# Patient Record
Sex: Female | Born: 1995
Health system: Southern US, Community
[De-identification: ages and names within clinical notes are randomized; demographics above are authoritative.]

## PROBLEM LIST (undated history)

## (undated) ENCOUNTER — Inpatient Hospital Stay (HOSPITAL_COMMUNITY): Payer: Self-pay

## (undated) ENCOUNTER — Ambulatory Visit

## (undated) ENCOUNTER — Emergency Department (HOSPITAL_COMMUNITY): Discharge status: Absent without leave | Payer: Federal, State, Local not specified - PPO

## (undated) DIAGNOSIS — Z3490 Encounter for supervision of normal pregnancy, unspecified, unspecified trimester: Principal | ICD-10-CM

## (undated) DIAGNOSIS — I7300 Raynaud's syndrome without gangrene: Secondary | ICD-10-CM

## (undated) DIAGNOSIS — E119 Type 2 diabetes mellitus without complications: Secondary | ICD-10-CM

## (undated) DIAGNOSIS — Z8632 Personal history of gestational diabetes: Secondary | ICD-10-CM

## (undated) HISTORY — DX: Encounter for supervision of normal pregnancy, unspecified, unspecified trimester: Z34.90

## (undated) HISTORY — DX: Personal history of gestational diabetes: Z86.32

## (undated) HISTORY — DX: Raynaud's syndrome without gangrene: I73.00

## (undated) HISTORY — PX: TONSILLECTOMY: SUR1361

---
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
## 2016-04-23 ENCOUNTER — Other Ambulatory Visit: Payer: Self-pay | Admitting: Obstetrics & Gynecology

## 2016-04-30 ENCOUNTER — Encounter (INDEPENDENT_AMBULATORY_CARE_PROVIDER_SITE_OTHER): Payer: Self-pay

## 2016-04-30 ENCOUNTER — Ambulatory Visit: Payer: Federal, State, Local not specified - PPO | Admitting: Obstetrics & Gynecology

## 2016-05-07 ENCOUNTER — Ambulatory Visit: Payer: Federal, State, Local not specified - PPO | Admitting: Obstetrics & Gynecology

## 2016-05-14 ENCOUNTER — Ambulatory Visit: Payer: Federal, State, Local not specified - PPO | Admitting: Obstetrics & Gynecology

## 2016-12-17 ENCOUNTER — Other Ambulatory Visit: Payer: Self-pay | Admitting: Obstetrics & Gynecology

## 2016-12-17 MED ORDER — ESCITALOPRAM OXALATE 20 MG PO TABS: 20.0000 mg | ORAL_TABLET | Freq: Every day | ORAL | 11 refills | Status: DC

## 2016-12-19 ENCOUNTER — Other Ambulatory Visit: Payer: Self-pay | Admitting: Obstetrics & Gynecology

## 2017-01-08 ENCOUNTER — Encounter: Payer: Self-pay | Admitting: Obstetrics & Gynecology

## 2017-01-08 ENCOUNTER — Ambulatory Visit (INDEPENDENT_AMBULATORY_CARE_PROVIDER_SITE_OTHER): Payer: Federal, State, Local not specified - PPO | Admitting: Obstetrics & Gynecology

## 2017-01-08 VITALS — BP 114/60 | HR 78 | Ht 65.0 in | Wt 229.0 lb

## 2017-01-08 DIAGNOSIS — F339 Major depressive disorder, recurrent, unspecified: Secondary | ICD-10-CM

## 2017-01-08 MED ORDER — VENLAFAXINE HCL ER 75 MG PO CP24: 75.0000 mg | ORAL_CAPSULE | Freq: Every day | ORAL | 3 refills | Status: DC

## 2017-01-08 NOTE — Progress Notes (Signed)
  Subjective:     Rebecca Gibbs is a 22 y.o. female who presents for follow up of depression/anxiety. She has stopped all medications  She states the effexor did better than anything else really  Current symptoms include psychomotor agitation. Symptoms have been gradually improving since that time. Patient denies anhedonia, depressed mood, difficulty concentrating, fatigue, feelings of worthlessness/guilt, hopelessness, hypersomnia, impaired memory, insomnia, psychomotor retardation, recurrent thoughts of death, suicidal attempt, suicidal thoughts with specific plan, suicidal thoughts without plan, weight gain and weight loss. Previous treatment includes: medication. She complains of the following side effects from the treatment: none  The following portions of the patient's history were reviewed and updated as appropriate: allergies, current medications, past family history, past medical history, past social history, past surgical history and problem list.  Review of Systems Pertinent items are noted in HPI.    Objective:    BP 114/60 (BP Location: Right Arm, Patient Position: Sitting, Cuff Size: Normal)   Pulse 78   Ht 5\' 5"  (1.651 m)   Wt 229 lb (103.9 kg)   BMI 38.11 kg/m   General:  alert, cooperative and no distress  Affect & Behavior:  full facial expressions, good grooming, good insight, normal perception, normal reasoning, normal speech pattern and content and normal thought patterns       Assessment:    Depression, stable      Plan:   M  medication: effexor XR 75 mg See if this helps her agitation and hyperkinesis  Referral made to [facility] health     Face to face time:  15 minutes  Greater than 50% of the visit time was spent in counseling and coordination of care with the patient.  The summary and outline of the counseling and care coordination is summarized in the note above.   All questions were answered.

## 2017-01-09 ENCOUNTER — Other Ambulatory Visit: Payer: Self-pay | Admitting: *Deleted

## 2017-01-09 DIAGNOSIS — F339 Major depressive disorder, recurrent, unspecified: Secondary | ICD-10-CM
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
## 2017-02-22 ENCOUNTER — Encounter (HOSPITAL_COMMUNITY): Payer: Self-pay | Admitting: Psychiatry

## 2017-02-22 ENCOUNTER — Ambulatory Visit (INDEPENDENT_AMBULATORY_CARE_PROVIDER_SITE_OTHER): Payer: Federal, State, Local not specified - PPO | Admitting: Psychiatry

## 2017-02-22 VITALS — BP 155/94 | HR 87 | Ht 65.0 in | Wt 218.0 lb

## 2017-02-22 DIAGNOSIS — R450 Nervousness: Secondary | ICD-10-CM

## 2017-02-22 DIAGNOSIS — R51 Headache: Secondary | ICD-10-CM | POA: Diagnosis not present

## 2017-02-22 DIAGNOSIS — Z818 Family history of other mental and behavioral disorders: Secondary | ICD-10-CM | POA: Diagnosis not present

## 2017-02-22 DIAGNOSIS — Z62811 Personal history of psychological abuse in childhood: Secondary | ICD-10-CM | POA: Diagnosis not present

## 2017-02-22 DIAGNOSIS — Z630 Problems in relationship with spouse or partner: Secondary | ICD-10-CM

## 2017-02-22 DIAGNOSIS — F410 Panic disorder [episodic paroxysmal anxiety] without agoraphobia: Secondary | ICD-10-CM

## 2017-02-22 DIAGNOSIS — F419 Anxiety disorder, unspecified: Secondary | ICD-10-CM

## 2017-02-22 DIAGNOSIS — F331 Major depressive disorder, recurrent, moderate: Secondary | ICD-10-CM | POA: Diagnosis not present

## 2017-02-22 DIAGNOSIS — G4700 Insomnia, unspecified: Secondary | ICD-10-CM | POA: Diagnosis not present

## 2017-02-22 LAB — TSH: TSH: 1.81 mIU/L

## 2017-02-22 MED ORDER — SERTRALINE HCL 50 MG PO TABS: ORAL_TABLET | ORAL | 0 refills | Status: DC

## 2017-02-22 NOTE — Patient Instructions (Signed)
1. Start sertraline 25 mg daily for one week, then 50 mg daily 2. Check TSH 3. Referral to therapy 4. Return to clinic in one month for 30 mins

## 2017-02-28 ENCOUNTER — Ambulatory Visit (HOSPITAL_COMMUNITY): Payer: Federal, State, Local not specified - PPO | Admitting: Psychiatry

## 2017-03-07 DIAGNOSIS — J069 Acute upper respiratory infection, unspecified: Secondary | ICD-10-CM | POA: Diagnosis not present
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
## 2017-03-25 ENCOUNTER — Ambulatory Visit (HOSPITAL_COMMUNITY): Payer: Federal, State, Local not specified - PPO | Admitting: Psychiatry

## 2017-03-26 ENCOUNTER — Ambulatory Visit (HOSPITAL_COMMUNITY): Payer: Self-pay | Admitting: Licensed Clinical Social Worker

## 2017-03-27 DIAGNOSIS — S99922A Unspecified injury of left foot, initial encounter: Secondary | ICD-10-CM | POA: Diagnosis not present

## 2017-03-27 DIAGNOSIS — M79672 Pain in left foot: Secondary | ICD-10-CM | POA: Diagnosis not present
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
## 2017-04-02 ENCOUNTER — Ambulatory Visit (HOSPITAL_COMMUNITY): Payer: Federal, State, Local not specified - PPO | Admitting: Psychiatry

## 2017-04-12 ENCOUNTER — Other Ambulatory Visit (HOSPITAL_COMMUNITY): Payer: Self-pay | Admitting: Psychiatry

## 2017-04-12 MED ORDER — SERTRALINE HCL 50 MG PO TABS: 50.0000 mg | ORAL_TABLET | Freq: Every day | ORAL | 0 refills | Status: DC

## 2017-07-15 DIAGNOSIS — J209 Acute bronchitis, unspecified: Secondary | ICD-10-CM | POA: Diagnosis not present

## 2017-07-17 DIAGNOSIS — M130 Polyarthritis, unspecified: Secondary | ICD-10-CM | POA: Diagnosis not present

## 2017-07-17 DIAGNOSIS — R5383 Other fatigue: Secondary | ICD-10-CM | POA: Diagnosis not present

## 2017-07-17 DIAGNOSIS — Z68.41 Body mass index (BMI) pediatric, greater than or equal to 95th percentile for age: Secondary | ICD-10-CM | POA: Diagnosis not present

## 2017-07-17 DIAGNOSIS — R21 Rash and other nonspecific skin eruption: Secondary | ICD-10-CM | POA: Diagnosis not present

## 2017-07-23 DIAGNOSIS — H5213 Myopia, bilateral: Secondary | ICD-10-CM | POA: Diagnosis not present

## 2017-08-09 DIAGNOSIS — H5213 Myopia, bilateral: Secondary | ICD-10-CM | POA: Diagnosis not present

## 2017-08-09 DIAGNOSIS — H52221 Regular astigmatism, right eye: Secondary | ICD-10-CM | POA: Diagnosis not present

## 2017-08-15 DIAGNOSIS — M130 Polyarthritis, unspecified: Secondary | ICD-10-CM | POA: Diagnosis not present

## 2017-11-04 DIAGNOSIS — J019 Acute sinusitis, unspecified: Secondary | ICD-10-CM | POA: Diagnosis not present

## 2017-11-20 DIAGNOSIS — Z3201 Encounter for pregnancy test, result positive: Secondary | ICD-10-CM | POA: Diagnosis not present

## 2017-11-20 DIAGNOSIS — J301 Allergic rhinitis due to pollen: Secondary | ICD-10-CM | POA: Diagnosis not present

## 2017-12-04 ENCOUNTER — Other Ambulatory Visit: Payer: Self-pay | Admitting: Obstetrics and Gynecology

## 2017-12-04 DIAGNOSIS — O3680X0 Pregnancy with inconclusive fetal viability, not applicable or unspecified: Secondary | ICD-10-CM

## 2017-12-05 ENCOUNTER — Ambulatory Visit (INDEPENDENT_AMBULATORY_CARE_PROVIDER_SITE_OTHER): Payer: Federal, State, Local not specified - PPO

## 2017-12-05 ENCOUNTER — Other Ambulatory Visit: Payer: Self-pay | Admitting: Obstetrics and Gynecology

## 2017-12-05 ENCOUNTER — Other Ambulatory Visit: Payer: Federal, State, Local not specified - PPO

## 2017-12-05 DIAGNOSIS — Z3401 Encounter for supervision of normal first pregnancy, first trimester: Secondary | ICD-10-CM | POA: Diagnosis not present

## 2017-12-05 DIAGNOSIS — O3680X0 Pregnancy with inconclusive fetal viability, not applicable or unspecified: Secondary | ICD-10-CM | POA: Diagnosis not present

## 2017-12-05 NOTE — Progress Notes (Signed)
US TA/TV: no IUP visualized,normal ovaries bilat,bilat adnexa's wnl,EEC 10.8 mm,pt is having labs today per Victorino DikeJennifer

## 2017-12-06 LAB — BETA HCG QUANT (REF LAB): HCG QUANT: 164 m[IU]/mL

## 2017-12-07 ENCOUNTER — Emergency Department (HOSPITAL_COMMUNITY)
Admission: EM | Admit: 2017-12-07 | Discharge: 2017-12-07 | Discharge status: Against Medical Advice | Payer: Federal, State, Local not specified - PPO

## 2017-12-07 ENCOUNTER — Encounter: Payer: Self-pay | Admitting: Obstetrics & Gynecology

## 2017-12-07 DIAGNOSIS — Z3A01 Less than 8 weeks gestation of pregnancy: Secondary | ICD-10-CM | POA: Diagnosis not present

## 2017-12-07 DIAGNOSIS — N760 Acute vaginitis: Secondary | ICD-10-CM | POA: Diagnosis not present

## 2017-12-07 DIAGNOSIS — O26851 Spotting complicating pregnancy, first trimester: Secondary | ICD-10-CM | POA: Diagnosis not present

## 2017-12-07 DIAGNOSIS — O039 Complete or unspecified spontaneous abortion without complication: Secondary | ICD-10-CM | POA: Diagnosis not present

## 2017-12-07 DIAGNOSIS — O23591 Infection of other part of genital tract in pregnancy, first trimester: Secondary | ICD-10-CM | POA: Diagnosis not present

## 2017-12-09 ENCOUNTER — Telehealth: Payer: Self-pay | Admitting: Obstetrics & Gynecology

## 2017-12-09 DIAGNOSIS — R799 Abnormal finding of blood chemistry, unspecified: Secondary | ICD-10-CM | POA: Diagnosis not present

## 2017-12-09 DIAGNOSIS — Z5321 Procedure and treatment not carried out due to patient leaving prior to being seen by health care provider: Secondary | ICD-10-CM | POA: Diagnosis not present

## 2017-12-09 NOTE — Telephone Encounter (Signed)
Patient called, would like lab results from last week.  [phone]

## 2017-12-09 NOTE — Telephone Encounter (Signed)
Patient informed HCG 164.

## 2017-12-10 ENCOUNTER — Encounter: Payer: Self-pay | Admitting: Obstetrics & Gynecology

## 2017-12-10 ENCOUNTER — Ambulatory Visit (INDEPENDENT_AMBULATORY_CARE_PROVIDER_SITE_OTHER): Payer: Federal, State, Local not specified - PPO | Admitting: Obstetrics & Gynecology

## 2017-12-10 DIAGNOSIS — O209 Hemorrhage in early pregnancy, unspecified: Secondary | ICD-10-CM | POA: Diagnosis not present

## 2017-12-10 NOTE — Progress Notes (Signed)
Follow up appointment for results  Chief Complaint  Patient presents with  . Vaginal Bleeding    There were no vitals taken for this visit.  DATING AND VIABILITY SONOGRAM   Rebecca Gibbs is a 22 y.o. year old G2P1001 with LMP some time in October,2019.  She has regular menstrual cycles.   She is here today for a confirmatory initial sonogram.    GESTATION: No IUP visualized      FETAL ACTIVITY:          Heart rate         n/a       CERVIX: Appears closed   ADNEXA: The ovaries are normal.   GESTATIONAL AGE AND  BIOMETRICS:  Gestational criteria: Estimated Date of Delivery: unknown LMP, some time in October.  Previous Scans:0  GESTATIONAL SAC No IUP visualized    CROWN RUMP LENGTH                                                                                WORKING EDD: to be determined      TECHNICIAN COMMENTS:  US TA/TV: no IUP visualized,normal ovaries bilat,bilat adnexa's wnl,EEC 10.8 mm,pt is having labs today per Victorino DikeJennifer        A copy of this report including all images has been saved and backed up to a second source for retrieval if needed. All measures and details of the anatomical scan, placentation, fluid volume and pelvic anatomy are contained in that report.  Amber Flora LippsJ Carl 12/05/2017 4:08 PM    HCG 12/5 167 HCG 12/7 184 Repeated yesterday trying to get report  MEDS ordered this encounter: No orders of the defined types were placed in this encounter.   Orders for this encounter: No orders of the defined types were placed in this encounter.   Impression: 1. Bleeding in early pregnancy Most likely failed early pregnancy   Plan: Get HCG level from yesterday but her bleeding would suggest early loss  Follow Up: Return if symptoms worsen or fail to improve.     All questions were answered.  Past Medical History:  Diagnosis Date  . Diabetes mellitus without  complication (HCC)    Gestational DM, on Glyburide  . Pregnant 01/07/2014  . Raynaud's disease     Past Surgical History:  Procedure Laterality Date  . TONSILLECTOMY      OB History    Gravida  2   Para  1   Term  1   Preterm      AB      Living  1     SAB      TAB      Ectopic      Multiple  0   Live Births  1           No Known Allergies  Social History   Socioeconomic History  . Marital status: Single    Spouse name: Not on file  . Number of children: Not on file  . Years of education: Not on file  . Highest education level: Not on file  Occupational History  . Not on file  Social Needs  . Financial resource strain: Not  on file  . Food insecurity:    Worry: Not on file    Inability: Not on file  . Transportation needs:    Medical: Not on file    Non-medical: Not on file  Tobacco Use  . Smoking status: Never Smoker  . Smokeless tobacco: Never Used  Substance and Sexual Activity  . Alcohol use: No  . Drug use: No  . Sexual activity: Yes    Partners: Male    Birth control/protection: Diaphragm  Lifestyle  . Physical activity:    Days per week: Not on file    Minutes per session: Not on file  . Stress: Not on file  Relationships  . Social connections:    Talks on phone: Not on file    Gets together: Not on file    Attends religious service: Not on file    Active member of club or organization: Not on file    Attends meetings of clubs or organizations: Not on file    Relationship status: Not on file  Other Topics Concern  . Not on file  Social History Narrative  . Not on file    Family History  Problem Relation Age of Onset  . Other Paternal Grandfather        has a pacemaker  . Cancer Maternal Grandmother        cervical  . Depression Maternal Grandmother   . Cancer Maternal Grandfather        lung that went to brain  . Diabetes Mother   . Arthritis Mother   . Other Mother        auto immune disease  . Depression Mother

## 2018-03-14 DIAGNOSIS — K080 Exfoliation of teeth due to systemic causes: Secondary | ICD-10-CM | POA: Diagnosis not present

## 2018-03-17 DIAGNOSIS — K8042 Calculus of bile duct with acute cholecystitis without obstruction: Secondary | ICD-10-CM | POA: Diagnosis not present

## 2018-03-17 DIAGNOSIS — M545 Low back pain: Secondary | ICD-10-CM | POA: Diagnosis not present

## 2018-03-17 DIAGNOSIS — R109 Unspecified abdominal pain: Secondary | ICD-10-CM | POA: Diagnosis not present

## 2018-03-17 DIAGNOSIS — N23 Unspecified renal colic: Secondary | ICD-10-CM | POA: Diagnosis not present

## 2018-03-24 DIAGNOSIS — R1084 Generalized abdominal pain: Secondary | ICD-10-CM | POA: Diagnosis not present

## 2018-03-24 DIAGNOSIS — K8020 Calculus of gallbladder without cholecystitis without obstruction: Secondary | ICD-10-CM | POA: Diagnosis not present

## 2018-05-01 ENCOUNTER — Telehealth: Payer: Self-pay

## 2018-05-01 NOTE — Telephone Encounter (Signed)
Called pt to schedule apt. Pt unavailable will call again to schedule apt.

## 2018-05-08 ENCOUNTER — Ambulatory Visit (INDEPENDENT_AMBULATORY_CARE_PROVIDER_SITE_OTHER): Payer: Federal, State, Local not specified - PPO | Admitting: General Surgery

## 2018-05-08 ENCOUNTER — Encounter: Payer: Self-pay | Admitting: General Surgery

## 2018-05-08 ENCOUNTER — Other Ambulatory Visit: Payer: Self-pay

## 2018-05-08 VITALS — BP 139/73 | HR 88 | Temp 98.9°F | Resp 16 | Wt 254.0 lb

## 2018-05-08 DIAGNOSIS — K8020 Calculus of gallbladder without cholecystitis without obstruction: Secondary | ICD-10-CM | POA: Insufficient documentation

## 2018-05-08 NOTE — Patient Instructions (Signed)
Laparoscopic Cholecystectomy Laparoscopic cholecystectomy is surgery to remove the gallbladder. The gallbladder is a pear-shaped organ that lies beneath the liver on the right side of the body. The gallbladder stores bile, which is a fluid that helps the body to digest fats. Cholecystectomy is often done for inflammation of the gallbladder (cholecystitis). This condition is usually caused by a buildup of gallstones (cholelithiasis) in the gallbladder. Gallstones can block the flow of bile, which can result in inflammation and pain. In severe cases, emergency surgery may be required. This procedure is done though small incisions in your abdomen (laparoscopic surgery). A thin scope with a camera (laparoscope) is inserted through one incision. Thin surgical instruments are inserted through the other incisions. In some cases, a laparoscopic procedure may be turned into a type of surgery that is done through a larger incision (open surgery). Tell a health care provider about:  Any allergies you have.  All medicines you are taking, including vitamins, herbs, eye drops, creams, and over-the-counter medicines.  Any problems you or family members have had with anesthetic medicines.  Any blood disorders you have.  Any surgeries you have had.  Any medical conditions you have.  Whether you are pregnant or may be pregnant. What are the risks? Generally, this is a safe procedure. However, problems may occur, including:  Infection.  Bleeding.  Allergic reactions to medicines.  Damage to other structures or organs.  A stone remaining in the common bile duct. The common bile duct carries bile from the gallbladder into the small intestine.  A bile leak from the cyst duct that is clipped when your gallbladder is removed. Medicines  Ask your health care provider about: ? Changing or stopping your regular medicines. This is especially important if you are taking diabetes medicines or blood  thinners. ? Taking medicines such as aspirin and ibuprofen. These medicines can thin your blood. Do not take these medicines before your procedure if your health care provider instructs you not to.  You may be given antibiotic medicine to help prevent infection. General instructions  Let your health care provider know if you develop a cold or an infection before surgery.  Plan to have someone take you home from the hospital or clinic.  Ask your health care provider how your surgical site will be marked or identified. What happens during the procedure?   To reduce your risk of infection: ? Your health care team will wash or sanitize their hands. ? Your skin will be washed with soap. ? Hair may be removed from the surgical area.  An IV tube may be inserted into one of your veins.  You will be given one or more of the following: ? A medicine to help you relax (sedative). ? A medicine to make you fall asleep (general anesthetic).  A breathing tube will be placed in your mouth.  Your surgeon will make several small cuts (incisions) in your abdomen.  The laparoscope will be inserted through one of the small incisions. The camera on the laparoscope will send images to a TV screen (monitor) in the operating room. This lets your surgeon see inside your abdomen.  Air-like gas will be pumped into your abdomen. This will expand your abdomen to give the surgeon more room to perform the surgery.  Other tools that are needed for the procedure will be inserted through the other incisions. The gallbladder will be removed through one of the incisions.  Your common bile duct may be examined. If stones are found   in the common bile duct, they may be removed.  After your gallbladder has been removed, the incisions will be closed with stitches (sutures), staples, or skin glue.  Your incisions may be covered with a bandage (dressing). The procedure may vary among health care providers and  hospitals. What happens after the procedure?  Your blood pressure, heart rate, breathing rate, and blood oxygen level will be monitored until the medicines you were given have worn off.  You will be given medicines as needed to control your pain.  Do not drive for 24 hours if you were given a sedative. This information is not intended to replace advice given to you by your health care provider. Make sure you discuss any questions you have with your health care provider. Document Released: 12/18/2004 Document Revised: 11/15/2016 Document Reviewed: 06/06/2015 Elsevier Interactive Patient Education  2019 Else[employer] Inc. Cholelithiasis  Cholelithiasis is also called "gallstones." It is a kind of gallbladder disease. The gallbladder is an organ that stores a liquid (bile) that helps you digest fat. Gallstones may not cause symptoms (may be silent gallstones) until they cause a blockage, and then they can cause pain (gallbladder attack). Follow these instructions at home:  Take over-the-counter and prescription medicines only as told by your doctor.  Stay at a healthy weight.  Eat healthy foods. This includes: ? Eating fewer fatty foods, like fried foods. ? Eating fewer refined carbs (refined carbohydrates). Refined carbs are breads and grains that are highly processed, like white bread and white rice. Instead, choose whole grains like whole-wheat bread and brown rice. ? Eating more fiber. Almonds, fresh fruit, and beans are healthy sources of fiber.  Keep all follow-up visits as told by your doctor. This is important. Contact a doctor if:  You have sudden pain in the upper right side of your belly (abdomen). Pain might spread to your right shoulder or your chest. This may be a sign of a gallbladder attack.  You feel sick to your stomach (are nauseous).  You throw up (vomit).  You have been diagnosed with gallstones that have no symptoms and you get: ? Belly pain. ? Discomfort, burning,  or fullness in the upper part of your belly (indigestion). Get help right away if:  You have sudden pain in the upper right side of your belly, and it lasts for more than 2 hours.  You have belly pain that lasts for more than 5 hours.  You have a fever or chills.  You keep feeling sick to your stomach or you keep throwing up.  Your skin or the whites of your eyes turn yellow (jaundice).  You have dark-colored pee (urine).  You have light-colored poop (stool). Summary  Cholelithiasis is also called "gallstones."  The gallbladder is an organ that stores a liquid (bile) that helps you digest fat.  Silent gallstones are gallstones that do not cause symptoms.  A gallbladder attack may cause sudden pain in the upper right side of your belly. Pain might spread to your right shoulder or your chest. If this happens, contact your doctor.  If you have sudden pain in the upper right side of your belly that lasts for more than 2 hours, get help right away. This information is not intended to replace advice given to you by your health care provider. Make sure you discuss any questions you have with your health care provider. Document Released: 06/06/2007 Document Revised: 09/04/2015 Document Reviewed: 09/04/2015 Elsevier Interactive Patient Education  2019 Elsevier Inc.  
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
## 2018-05-09 MED ORDER — OMEPRAZOLE 40 MG PO CPDR: 40.0000 mg | DELAYED_RELEASE_CAPSULE | Freq: Every day | ORAL | 1 refills | Status: DC

## 2018-05-12 NOTE — Patient Instructions (Signed)
Rebecca Gibbs  05/12/2018     @PREFPERIOPPHARMACY @   Your procedure is scheduled on  05/19/2018 .  Report to [facility] at  1020  A.M.  Call this number if you have problems the morning of surgery:  [phone]   Remember:  Do not eat or drink after midnight.                        Take these medicines the morning of surgery with A SIP OF WATER: Prilosec.    Do not wear jewelry, make-up or nail polish.  Do not wear lotions, powders, or perfumes, or deodorant.  Do not shave 48 hours prior to surgery.  Men may shave face and neck.  Do not bring valuables to the hospital.  [facility] is not responsible for any belongings or valuables.  Contacts, dentures or bridgework may not be worn into surgery.  Leave your suitcase in the car.  After surgery it may be brought to your room.  For patients admitted to the hospital, discharge time will be determined by your treatment team.  Patients discharged the day of surgery will not be allowed to drive home.   Name and phone number of your driver:   family Special instructions:  None  Please read over the following fact sheets that you were given. Anesthesia Post-op Instructions and Care and Recovery After Surgery       Laparoscopic Cholecystectomy, Care After This sheet gives you information about how to care for yourself after your procedure. Your health care provider may also give you more specific instructions. If you have problems or questions, contact your health care provider. What can I expect after the procedure? After the procedure, it is common to have:  Pain at your incision sites. You will be given medicines to control this pain.  Mild nausea or vomiting.  Bloating and possible shoulder pain from the air-like gas that was used during the procedure. Follow these instructions at home: Incision care   Follow instructions from your health care provider about how to take care of your incisions. Make sure you:  ? Wash your hands with soap and water before you change your bandage (dressing). If soap and water are not available, use hand sanitizer. ? Change your dressing as told by your health care provider. ? Leave stitches (sutures), skin glue, or adhesive strips in place. These skin closures may need to be in place for 2 weeks or longer. If adhesive strip edges start to loosen and curl up, you may trim the loose edges. Do not remove adhesive strips completely unless your health care provider tells you to do that.  Do not take baths, swim, or use a hot tub until your health care provider approves. Ask your health care provider if you can take showers. You may only be allowed to take sponge baths for bathing.  Check your incision area every day for signs of infection. Check for: ? More redness, swelling, or pain. ? More fluid or blood. ? Warmth. ? Pus or a bad smell. Activity  Do not drive or use heavy machinery while taking prescription pain medicine.  Do not lift anything that is heavier than 10 lb (4.5 kg) until your health care provider approves.  Do not play contact sports until your health care provider approves.  Do not drive for 24 hours if you were given a medicine to help you relax (sedative).  Rest  as needed. Do not return to work or school until your health care provider approves. General instructions  Take over-the-counter and prescription medicines only as told by your health care provider.  To prevent or treat constipation while you are taking prescription pain medicine, your health care provider may recommend that you: ? Drink enough fluid to keep your urine clear or pale yellow. ? Take over-the-counter or prescription medicines. ? Eat foods that are high in fiber, such as fresh fruits and vegetables, whole grains, and beans. ? Limit foods that are high in fat and processed sugars, such as fried and sweet foods. Contact a health care provider if:  You develop a rash.  You  have more redness, swelling, or pain around your incisions.  You have more fluid or blood coming from your incisions.  Your incisions feel warm to the touch.  You have pus or a bad smell coming from your incisions.  You have a fever.  One or more of your incisions breaks open. Get help right away if:  You have trouble breathing.  You have chest pain.  You have increasing pain in your shoulders.  You faint or feel dizzy when you stand.  You have severe pain in your abdomen.  You have nausea or vomiting that lasts for more than one day.  You have leg pain. This information is not intended to replace advice given to you by your health care provider. Make sure you discuss any questions you have with your health care provider. Document Released: 12/18/2004 Document Revised: 07/09/2015 Document Reviewed: 06/06/2015 Elsevier Interactive Patient Education  2019 [place] Anesthesia, Adult, Care After This sheet gives you information about how to care for yourself after your procedure. Your health care provider may also give you more specific instructions. If you have problems or questions, contact your health care provider. What can I expect after the procedure? After the procedure, the following side effects are common:  Pain or discomfort at the IV site.  Nausea.  Vomiting.  Sore throat.  Trouble concentrating.  Feeling cold or chills.  Weak or tired.  Sleepiness and fatigue.  Soreness and body aches. These side effects can affect parts of the body that were not involved in surgery. Follow these instructions at home:  For at least 24 hours after the procedure:  Have a responsible adult stay with you. It is important to have someone help care for you until you are awake and alert.  Rest as needed.  Do not: ? Participate in activities in which you could fall or become injured. ? Drive. ? Use heavy machinery. ? Drink alcohol. ? Take sleeping pills  or medicines that cause drowsiness. ? Make important decisions or sign legal documents. ? Take care of children on your own. Eating and drinking  Follow any instructions from your health care provider about eating or drinking restrictions.  When you feel hungry, start by eating small amounts of foods that are soft and easy to digest (bland), such as toast. Gradually return to your regular diet.  Drink enough fluid to keep your urine pale yellow.  If you vomit, rehydrate by drinking water, juice, or clear broth. General instructions  If you have sleep apnea, surgery and certain medicines can increase your risk for breathing problems. Follow instructions from your health care provider about wearing your sleep device: ? Anytime you are sleeping, including during daytime naps. ? While taking prescription pain medicines, sleeping medicines, or medicines that make you drowsy.  Return to your normal activities as told by your health care provider. Ask your health care provider what activities are safe for you.  Take over-the-counter and prescription medicines only as told by your health care provider.  If you smoke, do not smoke without supervision.  Keep all follow-up visits as told by your health care provider. This is important. Contact a health care provider if:  You have nausea or vomiting that does not get better with medicine.  You cannot eat or drink without vomiting.  You have pain that does not get better with medicine.  You are unable to pass urine.  You develop a skin rash.  You have a fever.  You have redness around your IV site that gets worse. Get help right away if:  You have difficulty breathing.  You have chest pain.  You have blood in your urine or stool, or you vomit blood. Summary  After the procedure, it is common to have a sore throat or nausea. It is also common to feel tired.  Have a responsible adult stay with you for the first 24 hours after  general anesthesia. It is important to have someone help care for you until you are awake and alert.  When you feel hungry, start by eating small amounts of foods that are soft and easy to digest (bland), such as toast. Gradually return to your regular diet.  Drink enough fluid to keep your urine pale yellow.  Return to your normal activities as told by your health care provider. Ask your health care provider what activities are safe for you. This information is not intended to replace advice given to you by your health care provider. Make sure you discuss any questions you have with your health care provider. Document Released: 03/26/2000 Document Revised: 08/03/2016 Document Reviewed: 08/03/2016 Elsevier Interactive Patient Education  2019 [employer].

## 2018-05-12 NOTE — H&P (Signed)
[facility] History and Physical  Reason for Referral: Cholelithiasis  Referring Physician:  Arlyss Repress Allwardt PA      Chief Complaint    Pre-op Exam      Rebecca Gibbs is a 23 y.o. female.  HPI:  Rebecca Gibbs is a 23 yo who reports having epigastric abdominal pain after eating greasy and fatty foods since the age of 74-13. She says she has altered her diet, but still has these issues whenever she eats anything greasy. She cannot eat [place], [place] food, or hamburger. She recently had an attack a few weeks ago and went to [facility] after speaking with her PCP. There labs were done but the [place] techs had gone for the day. She had reported normal labs and her vitals were good. She was discharged with plans for an outpatient [place].  She followed up and the US demonstrated gallstones.  She says that since that attack she has continued to have some achy pain in the epigastric region and that she has some associated nausea/vomiting.  She eats a lot of tuna because this does not cause the pain.       Past Medical History:  Diagnosis Date  . Diabetes mellitus without complication (HCC)    Gestational DM, on Glyburide  . Pregnant 01/07/2014  . Raynaud's disease          Past Surgical History:  Procedure Laterality Date  . TONSILLECTOMY           Family History  Problem Relation Age of Onset  . Other Paternal Grandfather        has a pacemaker  . Cancer Maternal Grandmother        cervical  . Depression Maternal Grandmother   . Cancer Maternal Grandfather        lung that went to brain  . Diabetes Mother   . Arthritis Mother   . Other Mother        auto immune disease  . Depression Mother     Social History       Tobacco Use  . Smoking status: Never Smoker  . Smokeless tobacco: Never Used  Substance Use Topics  . Alcohol use: No  . Drug use: No    Medications: I have reviewed the patient's current medications.  Allergies as of 05/08/2018   No Active Allergies        Medication List       Accurate as of May 08, 2018  1:59 PM. If you have any questions, ask your nurse or doctor.        etonogestrel-ethinyl estradiol 0.12-0.015 MG/24HR vaginal ring Commonly known as:  NuvaRing INSERT 1 RING VAGINALLY AS DIRECTED. REMOVE AFTER 3 WEEKS & WAIT 7 DAYS BEFORE INSERTING A NEW RING   naproxen sodium 550 MG tablet Commonly known as:  Anaprox DS Take 1 tablet (550 mg total) by mouth 2 (two) times daily with a meal.   sertraline 50 MG tablet Commonly known as:  ZOLOFT Take 1 tablet (50 mg total) by mouth daily.        ROS:  A comprehensive review of systems was negative except for: Gastrointestinal: positive for abdominal pain, nausea, reflux symptoms and vomiting  Blood pressure 139/73, pulse 88, temperature 98.9 F (37.2 C), temperature source Temporal, resp. rate 16, weight 254 lb (115.2 kg), SpO2 96 %. Physical Exam Vitals signs reviewed.  Constitutional:      Appearance: Normal appearance.  HENT:     Head: Normocephalic.  Nose: Nose normal.     Mouth/Throat:     Mouth: Mucous membranes are moist.  Eyes:     Pupils: Pupils are equal, round, and reactive to light.  Neck:     Musculoskeletal: Normal range of motion.  Cardiovascular:     Rate and Rhythm: Normal rate and regular rhythm.  Pulmonary:     Effort: Pulmonary effort is normal.     Breath sounds: Normal breath sounds.  Abdominal:     General: There is no distension.     Palpations: Abdomen is soft.     Tenderness: There is abdominal tenderness in the right upper quadrant and epigastric area.  Musculoskeletal: Normal range of motion.        General: No swelling.  Skin:    General: Skin is warm and dry.  Neurological:     General: No focal deficit present.     Mental Status: She is alert and oriented to person, place, and time.  Psychiatric:        Mood and Affect: Mood normal.        Behavior:  Behavior normal.        Thought Content: Thought content normal.        Judgment: Judgment normal.     [facility]: [facility]       Assessment & Plan:  Rebecca Kailrica D Fawaz is a 23 y.o. female with gallstones that are symptomatic. She has been having pain since her last major attack, and continues to have some nausea at baseline. She says that she is ready to get the gallbladder removed.   -PLAN: I counseled the patient about the indication, risks and benefits of laparoscopic cholecystectomy.  She understands there is a very small chance for bleeding, infection, injury to normal structures (including common bile duct), conversion to open surgery, persistent symptoms, evolution of postcholecystectomy diarrhea, need for secondary interventions, anesthesia reaction, cardiopulmonary issues and other risks not specifically detailed here. I described the expected recovery, the plan for follow-up and the restrictions during the recovery phase.  All questions were answered.  -She asked about a prescription for something that might help her eat and said her mom took something similar for acid.  I have prescribed her omeprazole to trial until we get her gallbladder out.   All questions were answered to the satisfaction of the patient.  We discussed the current COVID 19 pandemic and that elective cases are being added for 05/19/2018 onward. We discussed COVID 19 testing preop, and to expect to get tested.   After careful consideration, Rebecca Gibbs has decided to proceed.    Lucretia RoersLindsay C Deseree Zemaitis 05/08/2018, 1:59 PM   Preop CMP ordered since no LFTs on record. Preop COVID testing ordered.

## 2018-05-13 ENCOUNTER — Other Ambulatory Visit: Payer: Self-pay

## 2018-05-13 ENCOUNTER — Encounter (HOSPITAL_COMMUNITY): Payer: Self-pay

## 2018-05-13 ENCOUNTER — Encounter (HOSPITAL_COMMUNITY)
Admission: RE | Admit: 2018-05-13 | Discharge: 2018-05-13 | Disposition: A | Discharge status: Home / Self Care | Payer: Federal, State, Local not specified - PPO | Source: Ambulatory Visit | Attending: General Surgery | Admitting: General Surgery

## 2018-05-13 DIAGNOSIS — Z01812 Encounter for preprocedural laboratory examination: Secondary | ICD-10-CM | POA: Insufficient documentation

## 2018-05-13 DIAGNOSIS — K8020 Calculus of gallbladder without cholecystitis without obstruction: Secondary | ICD-10-CM | POA: Diagnosis not present

## 2018-05-13 LAB — COMPREHENSIVE METABOLIC PANEL
ALT: 36 U/L (ref 0–44)
AST: 23 U/L (ref 15–41)
Albumin: 3.8 g/dL (ref 3.5–5.0)
Alkaline Phosphatase: 56 U/L (ref 38–126)
Anion gap: 9 (ref 5–15)
BUN: 12 mg/dL (ref 6–20)
CO2: 23 mmol/L (ref 22–32)
Calcium: 8.5 mg/dL — ABNORMAL LOW (ref 8.9–10.3)
Chloride: 107 mmol/L (ref 98–111)
Creatinine, Ser: 0.69 mg/dL (ref 0.44–1.00)
GFR calc Af Amer: >60 mL/min (ref >60)
GFR calc non Af Amer: >60 mL/min (ref >60)
Glucose, Bld: 103 mg/dL — ABNORMAL HIGH (ref 70–99)
Potassium: 3.7 mmol/L (ref 3.5–5.1)
Sodium: 139 mmol/L (ref 135–145)
Total Bilirubin: 0.6 mg/dL (ref 0.3–1.2)
Total Protein: 7.2 g/dL (ref 6.5–8.1)

## 2018-05-13 LAB — CBC WITH DIFFERENTIAL/PLATELET
Abs Immature Granulocytes: 0.04 10*3/uL (ref 0.00–0.07)
Basophils Absolute: 0 10*3/uL (ref 0.0–0.1)
Basophils Relative: 1 %
Eosinophils Absolute: 0.3 10*3/uL (ref 0.0–0.5)
Eosinophils Relative: 3 %
HCT: 37.4 % (ref 36.0–46.0)
Hemoglobin: 12.1 g/dL (ref 12.0–15.0)
Immature Granulocytes: 1 %
Lymphocytes Relative: 30 %
Lymphs Abs: 2.5 10*3/uL (ref 0.7–4.0)
MCH: 27.6 pg (ref 26.0–34.0)
MCHC: 32.4 g/dL (ref 30.0–36.0)
MCV: 85.4 fL (ref 80.0–100.0)
Monocytes Absolute: 0.6 10*3/uL (ref 0.1–1.0)
Monocytes Relative: 7 %
Neutro Abs: 4.9 10*3/uL (ref 1.7–7.7)
Neutrophils Relative %: 58 %
Platelets: 303 10*3/uL (ref 150–400)
RBC: 4.38 MIL/uL (ref 3.87–5.11)
RDW: 14.9 % (ref 11.5–15.5)
WBC: 8.4 10*3/uL (ref 4.0–10.5)
nRBC: 0 % (ref 0.0–0.2)

## 2018-05-13 LAB — HCG, QUANTITATIVE, PREGNANCY: hCG, Beta Chain, Quant, S: <1 m[IU]/mL (ref <5)

## 2018-05-15 ENCOUNTER — Other Ambulatory Visit (HOSPITAL_COMMUNITY)
Admission: RE | Admit: 2018-05-15 | Discharge: 2018-05-15 | Disposition: A | Discharge status: Home / Self Care | Payer: Federal, State, Local not specified - PPO | Source: Ambulatory Visit | Attending: General Surgery | Admitting: General Surgery

## 2018-05-15 ENCOUNTER — Other Ambulatory Visit: Payer: Self-pay

## 2018-05-15 DIAGNOSIS — Z79899 Other long term (current) drug therapy: Secondary | ICD-10-CM | POA: Diagnosis not present

## 2018-05-15 DIAGNOSIS — Z6841 Body Mass Index (BMI) 40.0 and over, adult: Secondary | ICD-10-CM | POA: Diagnosis not present

## 2018-05-15 DIAGNOSIS — K8010 Calculus of gallbladder with chronic cholecystitis without obstruction: Secondary | ICD-10-CM | POA: Diagnosis not present

## 2018-05-15 DIAGNOSIS — Z1159 Encounter for screening for other viral diseases: Secondary | ICD-10-CM | POA: Diagnosis not present

## 2018-05-15 DIAGNOSIS — E669 Obesity, unspecified: Secondary | ICD-10-CM | POA: Diagnosis not present

## 2018-05-15 DIAGNOSIS — Z793 Long term (current) use of hormonal contraceptives: Secondary | ICD-10-CM | POA: Diagnosis not present

## 2018-05-15 DIAGNOSIS — K8020 Calculus of gallbladder without cholecystitis without obstruction: Secondary | ICD-10-CM | POA: Diagnosis not present

## 2018-05-16 LAB — NOVEL CORONAVIRUS, NAA (HOSP ORDER, SEND-OUT TO REF LAB; TAT 18-24 HRS): SARS-CoV-2, NAA: NOT DETECTED

## 2018-05-19 ENCOUNTER — Ambulatory Visit (HOSPITAL_COMMUNITY): Payer: Federal, State, Local not specified - PPO | Admitting: Anesthesiology

## 2018-05-19 ENCOUNTER — Encounter (HOSPITAL_COMMUNITY)
Admission: RE | Disposition: A | Discharge status: Home / Self Care | Payer: Self-pay | Source: Home / Self Care | Attending: General Surgery

## 2018-05-19 ENCOUNTER — Ambulatory Visit (HOSPITAL_COMMUNITY)
Admission: RE | Admit: 2018-05-19 | Discharge: 2018-05-19 | Disposition: A | Discharge status: Home / Self Care | Payer: Federal, State, Local not specified - PPO | Attending: General Surgery | Admitting: General Surgery

## 2018-05-19 ENCOUNTER — Other Ambulatory Visit: Payer: Self-pay

## 2018-05-19 ENCOUNTER — Encounter (HOSPITAL_COMMUNITY): Payer: Self-pay

## 2018-05-19 DIAGNOSIS — K8020 Calculus of gallbladder without cholecystitis without obstruction: Secondary | ICD-10-CM | POA: Diagnosis present

## 2018-05-19 DIAGNOSIS — Z6841 Body Mass Index (BMI) 40.0 and over, adult: Secondary | ICD-10-CM | POA: Diagnosis not present

## 2018-05-19 DIAGNOSIS — Z793 Long term (current) use of hormonal contraceptives: Secondary | ICD-10-CM | POA: Insufficient documentation

## 2018-05-19 DIAGNOSIS — K8010 Calculus of gallbladder with chronic cholecystitis without obstruction: Secondary | ICD-10-CM | POA: Insufficient documentation

## 2018-05-19 DIAGNOSIS — Z79899 Other long term (current) drug therapy: Secondary | ICD-10-CM | POA: Diagnosis not present

## 2018-05-19 DIAGNOSIS — E669 Obesity, unspecified: Secondary | ICD-10-CM | POA: Diagnosis not present

## 2018-05-19 DIAGNOSIS — Z1159 Encounter for screening for other viral diseases: Secondary | ICD-10-CM | POA: Insufficient documentation

## 2018-05-19 HISTORY — PX: CHOLECYSTECTOMY: SHX55

## 2018-05-19 SURGERY — LAPAROSCOPIC CHOLECYSTECTOMY
Anesthesia: General

## 2018-05-19 MED ORDER — SUCCINYLCHOLINE CHLORIDE 20 MG/ML IJ SOLN
INTRAMUSCULAR | Status: DC | PRN
  Administered 2018-05-19: 120 mg via INTRAVENOUS

## 2018-05-19 MED ORDER — LACTATED RINGERS IV SOLN
INTRAVENOUS | Status: DC
  Administered 2018-05-19: 11:00:00 via INTRAVENOUS

## 2018-05-19 MED ORDER — FENTANYL CITRATE (PF) 100 MCG/2ML IJ SOLN
INTRAMUSCULAR | Status: AC
  Filled 2018-05-19: qty 2

## 2018-05-19 MED ORDER — BUPIVACAINE HCL (PF) 0.5 % IJ SOLN
INTRAMUSCULAR | Status: AC
  Filled 2018-05-19: qty 30

## 2018-05-19 MED ORDER — SODIUM CHLORIDE 0.9 % IR SOLN
Status: DC | PRN
  Administered 2018-05-19: 1000 mL

## 2018-05-19 MED ORDER — BUPIVACAINE LIPOSOME 1.3 % IJ SUSP
INTRAMUSCULAR | Status: AC
  Filled 2018-05-19: qty 20

## 2018-05-19 MED ORDER — DIPHENHYDRAMINE HCL 50 MG/ML IJ SOLN
25.0000 mg | Freq: Once | INTRAMUSCULAR | Status: AC
  Administered 2018-05-19: 25 mg via INTRAVENOUS

## 2018-05-19 MED ORDER — SODIUM CHLORIDE 0.9 % IV SOLN
2.0000 g | INTRAVENOUS | Status: AC
  Administered 2018-05-19: 2 g via INTRAVENOUS

## 2018-05-19 MED ORDER — HYDROCODONE-ACETAMINOPHEN 7.5-325 MG PO TABS
1.0000 | ORAL_TABLET | Freq: Once | ORAL | Status: AC | PRN
  Administered 2018-05-19: 1 via ORAL
  Filled 2018-05-19: qty 1

## 2018-05-19 MED ORDER — LACTATED RINGERS IV SOLN: INTRAVENOUS | Status: DC

## 2018-05-19 MED ORDER — OXYCODONE HCL 5 MG PO TABS: 5.0000 mg | ORAL_TABLET | ORAL | 0 refills | Status: DC | PRN

## 2018-05-19 MED ORDER — BUPIVACAINE HCL (PF) 0.5 % IJ SOLN
INTRAMUSCULAR | Status: DC | PRN
  Administered 2018-05-19: 10 mL

## 2018-05-19 MED ORDER — CHLORHEXIDINE GLUCONATE CLOTH 2 % EX PADS: 6.0000 | MEDICATED_PAD | Freq: Once | CUTANEOUS | Status: DC

## 2018-05-19 MED ORDER — ONDANSETRON HCL 4 MG/2ML IJ SOLN
INTRAMUSCULAR | Status: DC | PRN
  Administered 2018-05-19: 4 mg via INTRAVENOUS

## 2018-05-19 MED ORDER — MIDAZOLAM HCL 2 MG/2ML IJ SOLN
INTRAMUSCULAR | Status: AC
  Filled 2018-05-19: qty 2

## 2018-05-19 MED ORDER — HEMOSTATIC AGENTS (NO CHARGE) OPTIME
TOPICAL | Status: DC | PRN
  Administered 2018-05-19: 1 via TOPICAL

## 2018-05-19 MED ORDER — FENTANYL CITRATE (PF) 100 MCG/2ML IJ SOLN
INTRAMUSCULAR | Status: DC | PRN
  Administered 2018-05-19 (×2): 50 ug via INTRAVENOUS
  Administered 2018-05-19: 100 ug via INTRAVENOUS

## 2018-05-19 MED ORDER — HYDROMORPHONE HCL 1 MG/ML IJ SOLN
0.2500 mg | INTRAMUSCULAR | Status: DC | PRN
  Administered 2018-05-19 (×2): 0.5 mg via INTRAVENOUS
  Filled 2018-05-19 (×2): qty 0.5

## 2018-05-19 MED ORDER — DIPHENHYDRAMINE HCL 50 MG/ML IJ SOLN
INTRAMUSCULAR | Status: AC
  Filled 2018-05-19: qty 1

## 2018-05-19 MED ORDER — MIDAZOLAM HCL 5 MG/5ML IJ SOLN
INTRAMUSCULAR | Status: DC | PRN
  Administered 2018-05-19 (×2): 2 mg via INTRAVENOUS

## 2018-05-19 MED ORDER — PROPOFOL 10 MG/ML IV BOLUS
INTRAVENOUS | Status: DC | PRN
  Administered 2018-05-19: 40 mg via INTRAVENOUS
  Administered 2018-05-19: 160 mg via INTRAVENOUS

## 2018-05-19 MED ORDER — PROPOFOL 10 MG/ML IV BOLUS
INTRAVENOUS | Status: AC
  Filled 2018-05-19: qty 20

## 2018-05-19 MED ORDER — DOCUSATE SODIUM 100 MG PO CAPS: 100.0000 mg | ORAL_CAPSULE | Freq: Two times a day (BID) | ORAL | 2 refills | Status: DC

## 2018-05-19 MED ORDER — MEPERIDINE HCL 50 MG/ML IJ SOLN: 6.2500 mg | INTRAMUSCULAR | Status: DC | PRN

## 2018-05-19 MED ORDER — ONDANSETRON HCL 4 MG/2ML IJ SOLN
INTRAMUSCULAR | Status: AC
  Filled 2018-05-19: qty 2

## 2018-05-19 MED ORDER — SODIUM CHLORIDE 0.9 % IV SOLN
INTRAVENOUS | Status: AC
  Filled 2018-05-19: qty 2

## 2018-05-19 MED ORDER — ROCURONIUM BROMIDE 100 MG/10ML IV SOLN
INTRAVENOUS | Status: DC | PRN
  Administered 2018-05-19: 25 mg via INTRAVENOUS
  Administered 2018-05-19: 10 mg via INTRAVENOUS

## 2018-05-19 MED ORDER — SUGAMMADEX SODIUM 200 MG/2ML IV SOLN
INTRAVENOUS | Status: DC | PRN
  Administered 2018-05-19: 100 mg via INTRAVENOUS

## 2018-05-19 MED ORDER — PROMETHAZINE HCL 25 MG/ML IJ SOLN: 6.2500 mg | INTRAMUSCULAR | Status: DC | PRN

## 2018-05-19 SURGICAL SUPPLY — 46 items
ADH SKN CLS APL DERMABOND .7 (GAUZE/BANDAGES/DRESSINGS) ×1
APL PRP STRL LF DISP 70% ISPRP (MISCELLANEOUS) ×1
APPLIER CLIP ROT 10 11.4 M/L (STAPLE) ×2
APR CLP MED LRG 11.4X10 (STAPLE) ×1
BAG RETRIEVAL 10 (BASKET) ×1
BLADE SURG 15 STRL LF DISP TIS (BLADE) ×1 IMPLANT
BLADE SURG 15 STRL SS (BLADE) ×2
CHLORAPREP W/TINT 26 (MISCELLANEOUS) ×2 IMPLANT
CLIP APPLIE ROT 10 11.4 M/L (STAPLE) ×1 IMPLANT
CLOTH BEACON ORANGE TIMEOUT ST (SAFETY) ×2 IMPLANT
COVER LIGHT HANDLE STERIS (MISCELLANEOUS) ×4 IMPLANT
DECANTER SPIKE VIAL GLASS SM (MISCELLANEOUS) ×2 IMPLANT
DERMABOND ADVANCED (GAUZE/BANDAGES/DRESSINGS) ×1
DERMABOND ADVANCED .7 DNX12 (GAUZE/BANDAGES/DRESSINGS) ×1 IMPLANT
ELECT REM PT RETURN 9FT ADLT (ELECTROSURGICAL) ×2
ELECTRODE REM PT RTRN 9FT ADLT (ELECTROSURGICAL) ×1 IMPLANT
FILTER SMOKE EVAC LAPAROSHD (FILTER) ×2 IMPLANT
GLOVE BIO SURGEON STRL SZ 6.5 (GLOVE) ×2 IMPLANT
GLOVE BIOGEL M 7.0 STRL (GLOVE) ×1 IMPLANT
GLOVE BIOGEL PI IND STRL 6.5 (GLOVE) ×1 IMPLANT
GLOVE BIOGEL PI IND STRL 7.0 (GLOVE) ×3 IMPLANT
GLOVE BIOGEL PI INDICATOR 6.5 (GLOVE) ×1
GLOVE BIOGEL PI INDICATOR 7.0 (GLOVE) ×3
GLOVE SURG SS PI 7.5 STRL IVOR (GLOVE) ×1 IMPLANT
GOWN STRL REUS W/TWL LRG LVL3 (GOWN DISPOSABLE) ×6 IMPLANT
HEMOSTAT SNOW SURGICEL 2X4 (HEMOSTASIS) ×2 IMPLANT
INST SET LAPROSCOPIC AP (KITS) ×2 IMPLANT
KIT TURNOVER KIT A (KITS) ×2 IMPLANT
MANIFOLD NEPTUNE II (INSTRUMENTS) ×2 IMPLANT
NDL INSUFFLATION 14GA 120MM (NEEDLE) ×1 IMPLANT
NEEDLE INSUFFLATION 14GA 120MM (NEEDLE) ×2 IMPLANT
NS IRRIG 1000ML POUR BTL (IV SOLUTION) ×2 IMPLANT
PACK LAP CHOLE LZT030E (CUSTOM PROCEDURE TRAY) ×2 IMPLANT
PAD ARMBOARD 7.5X6 YLW CONV (MISCELLANEOUS) ×2 IMPLANT
SET BASIN LINEN APH (SET/KITS/TRAYS/PACK) ×2 IMPLANT
SLEEVE ENDOPATH XCEL 5M (ENDOMECHANICALS) ×2 IMPLANT
SUT MNCRL AB 4-0 PS2 18 (SUTURE) ×3 IMPLANT
SUT VICRYL 0 UR6 27IN ABS (SUTURE) ×2 IMPLANT
SYS BAG RETRIEVAL 10MM (BASKET) ×1
SYSTEM BAG RETRIEVAL 10MM (BASKET) ×1 IMPLANT
TROCAR ENDO BLADELESS 11MM (ENDOMECHANICALS) ×2 IMPLANT
TROCAR XCEL NON-BLD 5MMX100MML (ENDOMECHANICALS) ×2 IMPLANT
TROCAR XCEL UNIV SLVE 11M 100M (ENDOMECHANICALS) ×2 IMPLANT
TUBE CONNECTING 12X1/4 (SUCTIONS) ×2 IMPLANT
TUBING INSUFFLATION (TUBING) ×2 IMPLANT
WARMER LAPAROSCOPE (MISCELLANEOUS) ×2 IMPLANT

## 2018-05-19 NOTE — Discharge Instructions (Signed)
Discharge Laparoscopic Surgery Instructions:  Common Complaints: Right shoulder pain is common after laparoscopic surgery. This is secondary to the gas used in the surgery being trapped under the diaphragm.  Walk to help your body absorb the gas. This will improve in a few days. Pain at the port sites are common, especially the larger port sites. This will improve with time.  Some nausea is common and poor appetite. The main goal is to stay hydrated the first few days after surgery.   Diet/ Activity: Diet as tolerated. You may not have an appetite, but it is important to stay hydrated. Drink 64 ounces of water a day. Your appetite will return with time.  Shower per your regular routine daily.  Do not take hot showers. Take warm showers that are less than 10 minutes. Rest and listen to your body, but do not remain in bed all day.  Walk everyday for at least 15-20 minutes. Deep cough and move around every 1-2 hours in the first few days after surgery.  Do not lift > 10 lbs, perform excessive bending, pushing, pulling, squatting for 1-2 weeks after surgery.  Do not pick at the dermabond glue on your incision sites.  This glue film will remain in place for 1-2 weeks and will start to peel off.  Do not place lotions or balms on your incision unless instructed to specifically by Dr. Henreitta LeberBridges.   Medication: Take tylenol and ibuprofen as needed for pain control, alternating every 4-6 hours.  Example:  Tylenol 1000mg  @ 6am, 12noon, 6pm, 12midnight (Do not exceed 4000mg  of tylenol a day). Ibuprofen 800mg  @ 9am, 3pm, 9pm, 3am (Do not exceed 3600mg  of ibuprofen a day).  Take Roxicodone for breakthrough pain every 4 hours.  Take Colace for constipation related to narcotic pain medication. If you do not have a bowel movement in 2 days, take Miralax over the counter.  Drink plenty of water to also prevent constipation.   Contact Information: If you have questions or concerns, please call our office,  [phone], Monday- Thursday 8AM-5PM and Friday 8AM-12Noon.  If it is after hours or on the weekend, please call [facility] Main Number, [phone], and ask to speak to the surgeon on call for Dr. Henreitta [facility]Bridges at [facility].    Laparoscopic Cholecystectomy, Care After This sheet gives you information about how to care for yourself after your procedure. Your doctor may also give you more specific instructions. If you have problems or questions, contact your doctor. Follow these instructions at home: Care for cuts from surgery (incisions)   Follow instructions from your doctor about how to take care of your cuts from surgery. Make sure you: ? Wash your hands with soap and water before you change your bandage (dressing). If you cannot use soap and water, use hand sanitizer. ? Change your bandage as told by your doctor. ? Leave stitches (sutures), skin glue, or skin tape (adhesive) strips in place. They may need to stay in place for 2 weeks or longer. If tape strips get loose and curl up, you may trim the loose edges. Do not remove tape strips completely unless your doctor says it is okay.  Do not take baths, swim, or use a hot tub until your doctor says it is okay.  You can shower.  Check your surgical cut area every day for signs of infection. Check for: ? More redness, swelling, or pain. ? More fluid or blood. ? Warmth. ? Pus or a bad smell. Activity  Do not  drive or use heavy machinery while taking prescription pain medicine.  Do not lift anything that is heavier than 10 lb (4.5 kg) until your doctor says it is okay.  Do not play contact sports until your doctor says it is okay.  Do not drive for 24 hours if you were given a medicine to help you relax (sedative).  Rest as needed. Do not return to work or school until your doctor says it is okay. General instructions  Take over-the-counter and prescription medicines only as told by your doctor.  To prevent or treat constipation while  you are taking prescription pain medicine, your doctor may recommend that you: ? Drink enough fluid to keep your pee (urine) clear or pale yellow. ? Take over-the-counter or prescription medicines. ? Eat foods that are high in fiber, such as fresh fruits and vegetables, whole grains, and beans. ? Limit foods that are high in fat and processed sugars, such as fried and sweet foods. Contact a doctor if:  You develop a rash.  You have more redness, swelling, or pain around your surgical cuts.  You have more fluid or blood coming from your surgical cuts.  Your surgical cuts feel warm to the touch.  You have pus or a bad smell coming from your surgical cuts.  You have a fever.  One or more of your surgical cuts breaks open. Get help right away if:  You have trouble breathing.  You have chest pain.  You have pain that is getting worse in your shoulders.  You faint or feel dizzy when you stand.  You have very bad pain in your belly (abdomen).  You are sick to your stomach (nauseous) for more than one day.  You have throwing up (vomiting) that lasts for more than one day.  You have leg pain. This information is not intended to replace advice given to you by your health care provider. Make sure you discuss any questions you have with your health care provider. Document Released: 09/27/2007 Document Revised: 07/09/2015 Document Reviewed: 06/06/2015 Elsevier Interactive Patient Education  2019 [employer]  General Anesthesia, Adult, Care After This sheet gives you information about how to care for yourself after your procedure. Your health care provider may also give you more specific instructions. If you have problems or questions, contact your health care provider. What can I expect after the procedure? After the procedure, the following side effects are common:  Pain or discomfort at the IV site.  Nausea.  Vomiting.  Sore throat.  Trouble concentrating.  Feeling  cold or chills.  Weak or tired.  Sleepiness and fatigue.  Soreness and body aches. These side effects can affect parts of the body that were not involved in surgery. Follow these instructions at home:  For at least 24 hours after the procedure:  Have a responsible adult stay with you. It is important to have someone help care for you until you are awake and alert.  Rest as needed.  Do not: ? Participate in activities in which you could fall or become injured. ? Drive. ? Use heavy machinery. ? Drink alcohol. ? Take sleeping pills or medicines that cause drowsiness. ? Make important decisions or sign legal documents. ? Take care of children on your own. Eating and drinking  Follow any instructions from your health care provider about eating or drinking restrictions.  When you feel hungry, start by eating small amounts of foods that are soft and easy to digest (bland), such as toast.  Gradually return to your regular diet.  Drink enough fluid to keep your urine pale yellow.  If you vomit, rehydrate by drinking water, juice, or clear broth. General instructions  If you have sleep apnea, surgery and certain medicines can increase your risk for breathing problems. Follow instructions from your health care provider about wearing your sleep device: ? Anytime you are sleeping, including during daytime naps. ? While taking prescription pain medicines, sleeping medicines, or medicines that make you drowsy.  Return to your normal activities as told by your health care provider. Ask your health care provider what activities are safe for you.  Take over-the-counter and prescription medicines only as told by your health care provider.  If you smoke, do not smoke without supervision.  Keep all follow-up visits as told by your health care provider. This is important. Contact a health care provider if:  You have nausea or vomiting that does not get better with medicine.  You cannot eat  or drink without vomiting.  You have pain that does not get better with medicine.  You are unable to pass urine.  You develop a skin rash.  You have a fever.  You have redness around your IV site that gets worse. Get help right away if:  You have difficulty breathing.  You have chest pain.  You have blood in your urine or stool, or you vomit blood. Summary  After the procedure, it is common to have a sore throat or nausea. It is also common to feel tired.  Have a responsible adult stay with you for the first 24 hours after general anesthesia. It is important to have someone help care for you until you are awake and alert.  When you feel hungry, start by eating small amounts of foods that are soft and easy to digest (bland), such as toast. Gradually return to your regular diet.  Drink enough fluid to keep your urine pale yellow.  Return to your normal activities as told by your health care provider. Ask your health care provider what activities are safe for you. This information is not intended to replace advice given to you by your health care provider. Make sure you discuss any questions you have with your health care provider. Document Released: 03/26/2000 Document Revised: 08/03/2016 Document Reviewed: 08/03/2016 Elsevier Interactive Patient Education  2019 [employer].

## 2018-05-19 NOTE — Anesthesia Preprocedure Evaluation (Signed)
Anesthesia Evaluation    Airway Mallampati: II       Dental  (+) Teeth Intact   Pulmonary    breath sounds clear to auscultation + decreased breath sounds      Cardiovascular  Rhythm:regular     Neuro/Psych    GI/Hepatic   Endo/Other  diabetes, Gestational  Renal/GU      Musculoskeletal   Abdominal   Peds  Hematology   Anesthesia Other Findings Obesity states 260# Non-metal piercings in ears, chest.  Advised while not a "burn" risk, possible site trauma from catch/tear Upper dentition with carries Decreased BS due to obesity Gestational obesity resolved for three years  Reproductive/Obstetrics                             Anesthesia Physical Anesthesia Plan  ASA: II  Anesthesia Plan: General   Post-op Pain Management:    Induction:   PONV Risk Score and Plan:   Airway Management Planned:   Additional Equipment:   Intra-op Plan:   Post-operative Plan:   Informed Consent: I have reviewed the patients History and Physical, chart, labs and discussed the procedure including the risks, benefits and alternatives for the proposed anesthesia with the patient or authorized representative who has indicated his/her understanding and acceptance.       Plan Discussed with: Anesthesiologist  Anesthesia Plan Comments:         Anesthesia Quick Evaluation

## 2018-05-19 NOTE — Interval H&P Note (Signed)
History and Physical Interval Note:  05/19/2018 10:27 AM  Rebecca Gibbs  has presented today for surgery, with the diagnosis of cholelithiasis.  The various methods of treatment have been discussed with the patient and family. After consideration of risks, benefits and other options for treatment, the patient has consented to  Procedure(s): LAPAROSCOPIC CHOLECYSTECTOMY (N/A) as a surgical intervention.  The patient's history has been reviewed, patient examined, no change in status, stable for surgery.  I have reviewed the patient's chart and labs.  Questions were answered to the patient's satisfaction.    No changes or questions.  [name]

## 2018-05-19 NOTE — Anesthesia Postprocedure Evaluation (Signed)
Anesthesia Post Note  Patient: Rebecca Gibbs  Procedure(s) Performed: LAPAROSCOPIC CHOLECYSTECTOMY (N/A )  Patient location during evaluation: PACU Anesthesia Type: General Level of consciousness: awake and oriented Pain management: satisfactory to patient Vital Signs Assessment: post-procedure vital signs reviewed and stable Respiratory status: spontaneous breathing Cardiovascular status: stable Postop Assessment: no apparent nausea or vomiting Anesthetic complications: no     Last Vitals:  Vitals:   05/19/18 1215 05/19/18 1230  BP: (!) 151/85 (!) 145/85  Pulse: 88 78  Resp: (!) 32 20  Temp: 36.9 C   SpO2: 91% 93%    Last Pain:  Vitals:   05/19/18 1230  TempSrc:   PainSc: Asleep                 Hildegarde Dunaway

## 2018-05-19 NOTE — Progress Notes (Signed)
1239 relieved Carren Rang, RN for lunch.

## 2018-05-19 NOTE — Progress Notes (Signed)
[facility]  Called and notified patient's mother that surgery went well.   Algis Greenhouse, MD [facility] [address] [place] [place], [place] [address] [phone] (office)

## 2018-05-19 NOTE — Transfer of Care (Signed)
Immediate Anesthesia Transfer of Care Note  Patient: Rebecca Gibbs  Procedure(s) Performed: LAPAROSCOPIC CHOLECYSTECTOMY (N/A )  Patient Location: PACU  Anesthesia Type:General  Level of Consciousness: awake and patient cooperative  Airway & Oxygen Therapy: Patient Spontanous Breathing  Post-op Assessment: Report given to RN, Post -op Vital signs reviewed and stable and Patient moving all extremities  Post vital signs: Reviewed and stable  Last Vitals:  Vitals Value Taken Time  BP    Temp    Pulse    Resp    SpO2      Last Pain:  Vitals:   05/19/18 1041  TempSrc:   PainSc: 0-No pain         Complications: No apparent anesthesia complications

## 2018-05-19 NOTE — Op Note (Signed)
Operative Note   Preoperative Diagnosis: Symptomatic cholelithiasis   Postoperative Diagnosis: Same   Procedure(s) Performed: Laparoscopic cholecystectomy   Surgeon: Lillia Abed C. Henreitta Leber, MD   Assistants: Franky Macho, MD    Anesthesia: General endotracheal   Anesthesiologist: Arbie Cookey, MD    Specimens: Gallbladder    Estimated Blood Loss: Minimal    Blood Replacement: None    Complications: None    Operative Findings:  Distended gallbladder with stones    Procedure: The patient was taken to the operating room and placed supine. General endotracheal anesthesia was induced. Intravenous antibiotics were administered per protocol. An orogastric tube positioned to decompress the stomach. The abdomen was prepared and draped in the usual sterile fashion.    A supraumbilical incision was made and a Veress technique was utilized to achieve pneumoperitoneum to 15 mmHg with carbon dioxide. A 11 mm optiview port was placed through the supraumbilical region, and a 10 mm 0-degree operative laparoscope was introduced. The area underlying the trocar and Veress needle were inspected and without evidence of injury.  Remaining trocars were placed under direct vision. Two 5 mm ports were placed in the right abdomen, between the anterior axillary and midclavicular line.  A final 11 mm port was placed through the mid-epigastrium, near the falciform ligament.    The gallbladder fundus was elevated cephalad and the infundibulum was retracted to the patient's right. The gallbladder/cystic duct junction was skeletonized. The cystic artery noted in the triangle of Calot and was also skeletonized.  We then continued liberal medial and lateral dissection until the critical view of safety was achieved.    The cystic duct was triply clipped and divided. The cystic artery was doubly clipped and a small posterior branch was clipped and divided. The gallbladder was then dissected from the liver bed with  electrocautery. The specimen was placed in an Endopouch, and all pneumoperitoneum was evacuated through the [place] due to COVID 19.    Final inspection revealed acceptable hemostasis. Surgical Jamelle Haring was placed in the gallbladder bed. The trocars were removed and the endopouch bag was pulled through the epigastric site. The epigastric and umbilical sites were smaller than my finger and the epigastric site was at the rib cage. Skin incisions were closed with 4-0 Monocryl subcuticular sutures and Dermabond. The patient was awakened from anesthesia and extubated without complication.    Algis Greenhouse, MD [facility] [address] [place], [place] [address] [phone] (office)

## 2018-05-19 NOTE — Anesthesia Procedure Notes (Signed)
Procedure Name: Intubation Date/Time: 05/19/2018 11:20 AM Performed by: Charmaine Downs, CRNA Pre-anesthesia Checklist: Patient identified, Patient being monitored, Timeout performed, Emergency Drugs available and Suction available Patient Re-evaluated:Patient Re-evaluated prior to induction Oxygen Delivery Method: Circle System Utilized Preoxygenation: Pre-oxygenation with 100% oxygen Induction Type: IV induction Ventilation: Mask ventilation without difficulty Laryngoscope Size: Mac and 4 Grade View: Grade II Tube type: Oral Tube size: 7.0 mm Number of attempts: 1 Airway Equipment and Method: stylet Placement Confirmation: ETT inserted through vocal cords under direct vision,  positive ETCO2 and breath sounds checked- equal and bilateral Secured at: 22 cm Tube secured with: Tape Dental Injury: Teeth and Oropharynx as per pre-operative assessment

## 2018-05-20 ENCOUNTER — Encounter (HOSPITAL_COMMUNITY): Payer: Self-pay | Admitting: General Surgery

## 2018-06-03 ENCOUNTER — Encounter: Payer: Self-pay | Admitting: General Surgery

## 2018-06-03 ENCOUNTER — Other Ambulatory Visit: Payer: Self-pay

## 2018-06-03 ENCOUNTER — Ambulatory Visit (INDEPENDENT_AMBULATORY_CARE_PROVIDER_SITE_OTHER): Payer: Self-pay | Admitting: General Surgery

## 2018-06-03 DIAGNOSIS — K8020 Calculus of gallbladder without cholecystitis without obstruction: Secondary | ICD-10-CM

## 2018-06-03 NOTE — Progress Notes (Signed)
[facility]  I am calling the patient for post operative evaluation due to the current COVID 19 pandemic.  The patient had a lap cholecystectomy on 05/19/18. The patient reports that they are doing good overall. Her incisions are healing and the dermabond is peeling but the right lateral is not completely healed. The are tolerating a diet but still cannot eat any tomato based/ pasta sauce, having good pain control, and having regular Bms.  The patient has no concerns.   Will see the patient PRN.   Pathology: Diagnosis Gallbladder - CHRONIC CHOLECYSTITIS WITH CHOLELITHIASIS. - CHOLESTEROLOSIS.  Rebecca Greenhouse, MD [facility] [address] [place], [place] [address] [phone] (office)

## 2018-06-05 ENCOUNTER — Other Ambulatory Visit: Payer: Self-pay

## 2018-06-05 MED ORDER — SULFAMETHOXAZOLE-TRIMETHOPRIM 800-160 MG PO TABS: 1.0000 | ORAL_TABLET | Freq: Two times a day (BID) | ORAL | 0 refills | Status: AC

## 2018-06-12 ENCOUNTER — Telehealth: Payer: Self-pay | Admitting: General Surgery

## 2018-06-12 MED ORDER — AMOXICILLIN-POT CLAVULANATE 875-125 MG PO TABS: 1.0000 | ORAL_TABLET | Freq: Two times a day (BID) | ORAL | 0 refills | Status: DC

## 2018-06-12 NOTE — Telephone Encounter (Signed)
[facility]  Called patient to see what patient needed. Patient called and had been placed on an antibiotic because she was worried about her lateral port site.  She is afraid the area is looking "icky" again.  She has been cleaning it with wound cleaner and put a bandaid on it.  Says there is a 1cm of redness around it, and some minor drainage.  Have sent in Augmentin.    If having problems will plan to see her in person Tuesday or Thursday afternoon.  Curlene Labrum, MD [facility] [address] [place], [place] [address] [phone] (office)

## 2018-06-20 ENCOUNTER — Other Ambulatory Visit: Payer: Self-pay | Admitting: Obstetrics & Gynecology

## 2018-06-24 DIAGNOSIS — M79672 Pain in left foot: Secondary | ICD-10-CM | POA: Diagnosis not present

## 2018-08-28 DIAGNOSIS — M79672 Pain in left foot: Secondary | ICD-10-CM | POA: Diagnosis not present

## 2018-12-03 ENCOUNTER — Other Ambulatory Visit: Payer: Self-pay | Admitting: General Surgery

## 2019-02-10 DIAGNOSIS — S20222A Contusion of left back wall of thorax, initial encounter: Secondary | ICD-10-CM | POA: Diagnosis not present

## 2019-02-10 DIAGNOSIS — M62838 Other muscle spasm: Secondary | ICD-10-CM | POA: Diagnosis not present

## 2019-04-08 DIAGNOSIS — Z3201 Encounter for pregnancy test, result positive: Secondary | ICD-10-CM | POA: Diagnosis not present

## 2019-04-08 DIAGNOSIS — R5383 Other fatigue: Secondary | ICD-10-CM | POA: Diagnosis not present

## 2019-04-13 ENCOUNTER — Telehealth: Payer: Self-pay | Admitting: Obstetrics & Gynecology

## 2019-04-13 ENCOUNTER — Other Ambulatory Visit: Payer: Self-pay | Admitting: Obstetrics & Gynecology

## 2019-04-13 DIAGNOSIS — O3680X0 Pregnancy with inconclusive fetal viability, not applicable or unspecified: Secondary | ICD-10-CM

## 2019-04-13 NOTE — Telephone Encounter (Signed)
Tried to call the patient to remind her of her appointment/restrictions, voicemail not set up.

## 2019-04-14 ENCOUNTER — Other Ambulatory Visit: Payer: Self-pay

## 2019-04-14 ENCOUNTER — Ambulatory Visit (INDEPENDENT_AMBULATORY_CARE_PROVIDER_SITE_OTHER): Payer: Federal, State, Local not specified - PPO

## 2019-04-14 DIAGNOSIS — O3680X0 Pregnancy with inconclusive fetal viability, not applicable or unspecified: Secondary | ICD-10-CM

## 2019-04-14 DIAGNOSIS — Z3A01 Less than 8 weeks gestation of pregnancy: Secondary | ICD-10-CM

## 2019-04-14 NOTE — Progress Notes (Signed)
[place] 6+3 wks single IUP with YS,positive fht 112 bpm,GS 9.3 mm=5+5 wks,subchorionic hemorrhage 1.4 x .5 x 1 cm,normal ovaries

## 2019-04-16 ENCOUNTER — Encounter: Payer: Self-pay | Admitting: Obstetrics & Gynecology

## 2019-04-16 ENCOUNTER — Other Ambulatory Visit: Payer: Self-pay

## 2019-04-16 ENCOUNTER — Other Ambulatory Visit (INDEPENDENT_AMBULATORY_CARE_PROVIDER_SITE_OTHER): Payer: Federal, State, Local not specified - PPO

## 2019-04-16 ENCOUNTER — Other Ambulatory Visit: Payer: Self-pay | Admitting: Obstetrics & Gynecology

## 2019-04-16 ENCOUNTER — Ambulatory Visit (INDEPENDENT_AMBULATORY_CARE_PROVIDER_SITE_OTHER): Payer: Federal, State, Local not specified - PPO | Admitting: Obstetrics & Gynecology

## 2019-04-16 VITALS — BP 120/74 | HR 88 | Ht 65.0 in | Wt 233.0 lb

## 2019-04-16 DIAGNOSIS — O3680X0 Pregnancy with inconclusive fetal viability, not applicable or unspecified: Secondary | ICD-10-CM | POA: Diagnosis not present

## 2019-04-16 DIAGNOSIS — O200 Threatened abortion: Secondary | ICD-10-CM | POA: Diagnosis not present

## 2019-04-16 DIAGNOSIS — Z3A01 Less than 8 weeks gestation of pregnancy: Secondary | ICD-10-CM | POA: Diagnosis not present

## 2019-04-16 NOTE — Progress Notes (Signed)
Follow up appointment for results  No chief complaint on file.   Blood pressure 120/74, pulse 88, height 5\' 5"  (1.651 m), weight 233 lb (105.7 kg), last menstrual period 02/16/2019.  02/18/2019 OB Transvaginal  Result Date: 04/16/2019 FOLLOW UP SONOGRAM Rebecca Gibbs is in the office for a follow up sonogram for viability. She is a 24 y.o. year old G3P1001 with Estimated Date of Delivery: 12/05/19 by early ultrasound now at  [redacted]w[redacted]d weeks gestation. Thus far the pregnancy has been complicated by vaginal bleeding. GESTATION: SINGLETON FETAL ACTIVITY:          Heart rate         90 bpm,bradycardia    CERVIX: Appears closed ADNEXA: The ovaries are normal. GESTATIONAL AGE AND  BIOMETRICS: Gestational criteria: Estimated Date of Delivery: 12/05/19 by early ultrasound now at [redacted]w[redacted]d Previous Scans:1 GESTATIONAL SAC           9.5 mm         5+5 weeks CROWN RUMP LENGTH           7.10 mm         6+4 weeks                                                                      AVERAGE EGA(BY THIS SCAN):  6+4 weeks SUSPECTED ABNORMALITIES:  subchorionic hemorrhage 1.3 x 1.4 x .8 cm,GS size discrepancy,GS  9.5 mm=5+5 wks,FHR 90 bpm QUALITY OF SCAN: satisfactory TECHNICIAN COMMENTS: [redacted]w[redacted]d TV:6+5 wks,single IUP with YS,FHR 90 bpm,GS size discrepancy,GS  9.5 mm=5+5 wks,subchorionic hemorrhage 1.3 x 1.4 x .8 cm A copy of this report including all images has been saved and backed up to a second source for retrieval if needed. All measures and details of the anatomical scan, placentation, fluid volume and pelvic anatomy are contained in that report. [name] 04/16/2019 10:49 AM Clinical Impression and recommendations: I have reviewed the sonogram results above, combined with the patient's current clinical course, below are my impressions and any appropriate recommendations for management based on the sonographic findings. Viable early IUP, associated fetal bradycardia, which is of concern G3P1001 Estimated Date of Delivery: 12/05/19 early  ultrasound Normal general sonographic findings Recommend routine care unless otherwise clinically indicated 14/4/21 04/16/2019 11:09 AM      MEDS ordered this encounter: No orders of the defined types were placed in this encounter.   Orders for this encounter: No orders of the defined types were placed in this encounter.   Impression:   ICD-10-CM   1. Threatened abortion  O20.0      Plan: Repeat scan in 1 week due to concern over borderline fetal heart rate  Follow Up: No follow-ups on file.       Face to face time:  15 minutes  Greater than 50% of the visit time was spent in counseling and coordination of care with the patient.  The summary and outline of the counseling and care coordination is summarized in the note above.   All questions were answered.  Past Medical History:  Diagnosis Date  . Diabetes mellitus without complication (HCC)    Gestational DM, on Glyburide  . Pregnant 01/07/2014  . Raynaud's disease     Past Surgical History:  Procedure Laterality Date  . CHOLECYSTECTOMY N/A 05/19/2018   Procedure: LAPAROSCOPIC CHOLECYSTECTOMY;  Surgeon: Virl Cagey, MD;  Location: [facility];  Service: General;  Laterality: N/A;  . TONSILLECTOMY      OB History    Gravida  3   Para  1   Term  1   Preterm      AB      Living  1     SAB      TAB      Ectopic      Multiple  0   Live Births  1           No Known Allergies  Social History   Socioeconomic History  . Marital status: Single    Spouse name: Not on file  . Number of children: Not on file  . Years of education: Not on file  . Highest education level: Not on file  Occupational History  . Not on file  Tobacco Use  . Smoking status: Never Smoker  . Smokeless tobacco: Never Used  Substance and Sexual Activity  . Alcohol use: No  . Drug use: No  . Sexual activity: Yes    Partners: Male    Birth control/protection: Diaphragm  Other Topics Concern  . Not on file   Social History Narrative  . Not on file   Social Determinants of Health   Financial Resource Strain:   . Difficulty of Paying Living Expenses:   Food Insecurity:   . Worried About Charity fundraiser in the Last Year:   . Arboriculturist in the Last Year:   Transportation Needs:   . Film/video editor (Medical):   Marland Kitchen Lack of Transportation (Non-Medical):   Physical Activity:   . Days of Exercise per Week:   . Minutes of Exercise per Session:   Stress:   . Feeling of Stress :   Social Connections:   . Frequency of Communication with Friends and Family:   . Frequency of Social Gatherings with Friends and Family:   . Attends Religious Services:   . Active Member of Clubs or Organizations:   . Attends Archivist Meetings:   Marland Kitchen Marital Status:     Family History  Problem Relation Age of Onset  . Other Paternal Grandfather        has a pacemaker  . Cancer Maternal Grandmother        cervical  . Depression Maternal Grandmother   . Cancer Maternal Grandfather        lung that went to brain  . Diabetes Mother   . Arthritis Mother   . Other Mother        auto immune disease  . Depression Mother

## 2019-04-16 NOTE — Progress Notes (Addendum)
US TV:6+5 wks,single IUP with YS,FHR 90 bpm,GS size discrepancy,GS  9.5 mm=5+5 wks,subchorionic hemorrhage 1.3 x 1.4 x .8 cm  Chaperone Sabrina

## 2019-04-21 ENCOUNTER — Telehealth: Payer: Self-pay | Admitting: Women's Health

## 2019-04-21 NOTE — Telephone Encounter (Signed)
Spoke to patient who states she had to have a tooth pulled and on her check out sheet from dental office, it said she could take Aleve.  Advised to patent to stick with just Tylenol and can take 500mg  x2 if needed. Pt verbalized understanding

## 2019-04-21 NOTE — Telephone Encounter (Signed)
Patien't mom called, stated she had some dental work today, she'd like to know what is safe for her to take.  [phone]

## 2019-04-22 ENCOUNTER — Other Ambulatory Visit: Payer: Self-pay | Admitting: Obstetrics & Gynecology

## 2019-04-22 DIAGNOSIS — O3680X0 Pregnancy with inconclusive fetal viability, not applicable or unspecified: Secondary | ICD-10-CM

## 2019-04-24 ENCOUNTER — Other Ambulatory Visit: Payer: Self-pay

## 2019-04-24 ENCOUNTER — Other Ambulatory Visit: Payer: Self-pay | Admitting: Obstetrics & Gynecology

## 2019-04-24 ENCOUNTER — Ambulatory Visit (INDEPENDENT_AMBULATORY_CARE_PROVIDER_SITE_OTHER): Payer: Federal, State, Local not specified - PPO | Admitting: Obstetrics & Gynecology

## 2019-04-24 ENCOUNTER — Encounter: Payer: Self-pay | Admitting: Obstetrics & Gynecology

## 2019-04-24 ENCOUNTER — Ambulatory Visit (INDEPENDENT_AMBULATORY_CARE_PROVIDER_SITE_OTHER): Payer: Federal, State, Local not specified - PPO

## 2019-04-24 VITALS — BP 127/81 | HR 88 | Ht 65.0 in | Wt 231.0 lb

## 2019-04-24 DIAGNOSIS — Z3A01 Less than 8 weeks gestation of pregnancy: Secondary | ICD-10-CM

## 2019-04-24 DIAGNOSIS — O021 Missed abortion: Secondary | ICD-10-CM | POA: Diagnosis not present

## 2019-04-24 DIAGNOSIS — O3680X0 Pregnancy with inconclusive fetal viability, not applicable or unspecified: Secondary | ICD-10-CM

## 2019-04-24 NOTE — Progress Notes (Signed)
US TV: 7+3 wks single IUP,no fetal heart tones visualized,GS 15.2 mm=6+2 wks,subchorionic hemorrhage 2.2 x 1.3 x .6 cm,normal ovaries

## 2019-04-24 NOTE — Progress Notes (Signed)
Follow up appointment for results  Chief Complaint  Patient presents with  . Follow-up    Ultrasound    Blood pressure 127/81, pulse 88, height 5\' 5"  (1.651 m), weight 231 lb (104.8 kg), last menstrual period 02/16/2019.  02/18/2019 OB Transvaginal  Result Date: 04/24/2019 FOLLOW UP VIABILITY SONOGRAM Rebecca Gibbs is a 24 y.o. year old G3P1001 with LMP Patient's last menstrual period was 02/16/2019. which would correlate to  [redacted]w[redacted]d weeks gestation.  She has regular menstrual cycles.   She is here today for a f/u viability sonogram. GESTATION: SINGLETON   FETAL ACTIVITY:          Heart rate         No FHT       CERVIX: Appears closed ADNEXA: The ovaries are normal. GESTATIONAL AGE AND  BIOMETRICS: Gestational criteria: Estimated Date of Delivery: 12/05/19 by early ultrasound now at [redacted]w[redacted]d Previous Scans:2 GESTATIONAL SAC           15.2 mm         6+2 weeks CROWN RUMP LENGTH           12.29 mm         7+3 weeks                                                                      AVERAGE EGA(BY THIS SCAN):  7+3 weeks  TECHNICIAN COMMENTS: [redacted]w[redacted]d TV: 7+3 wks single IUP,no fetal heart tones visualized,GS 15.2 mm=6+2 wks,subchorionic hemorrhage 2.2 x 1.3 x .6 cm,normal ovaries Chaperone Rebecca Gibbs A copy of this report including all images has been saved and backed up to a second source for retrieval if needed. All measures and details of the anatomical scan, placentation, fluid volume and pelvic anatomy are contained in that report. Rebecca Gibbs 04/24/2019 9:05 AM Clinical Impression and recommendations:Comparison study 04/14/19 I have reviewed the sonogram results above, combined with the patient's current clinical course, below are my impressions and any appropriate recommendations for management based on the sonographic findings. Non viable IUP, interval change from positive FCA to now no FCA G3P1001 Estimated Date of Delivery: 12/05/19 Normal general sonographic findings Recommend routine care unless otherwise clinically  indicated 14/4/21 04/24/2019 9:52 AM     MEDS ordered this encounter: No orders of the defined types were placed in this encounter.   Orders for this encounter: No orders of the defined types were placed in this encounter.   Impression:   ICD-10-CM   1. Missed ab  O02.1      Plan: Pt considering options of cytotec vs D&C She will communicate with me with ehr decision  Follow Up: Return if symptoms worsen or fail to improve.       Face to face time:  10 minutes  Greater than 50% of the visit time was spent in counseling and coordination of care with the patient.  The summary and outline of the counseling and care coordination is summarized in the note above.   All questions were answered.  Past Medical History:  Diagnosis Date  . Diabetes mellitus without complication (HCC)    Gestational DM, on Glyburide  . Pregnant 01/07/2014  . Raynaud's disease     Past Surgical History:  Procedure  Laterality Date  . CHOLECYSTECTOMY N/A 05/19/2018   Procedure: LAPAROSCOPIC CHOLECYSTECTOMY;  Surgeon: Rebecca Cagey, MD;  Location: [facility];  Service: General;  Laterality: N/A;  . TONSILLECTOMY      OB History    Gravida  3   Para  1   Term  1   Preterm      AB      Living  1     SAB      TAB      Ectopic      Multiple  0   Live Births  1           No Known Allergies  Social History   Socioeconomic History  . Marital status: Single    Spouse name: Not on file  . Number of children: Not on file  . Years of education: Not on file  . Highest education level: Not on file  Occupational History  . Not on file  Tobacco Use  . Smoking status: Never Smoker  . Smokeless tobacco: Never Used  Substance and Sexual Activity  . Alcohol use: No  . Drug use: No  . Sexual activity: Yes    Partners: Male    Birth control/protection: Diaphragm  Other Topics Concern  . Not on file  Social History Narrative  . Not on file   Social Determinants  of Health   Financial Resource Strain:   . Difficulty of Paying Living Expenses:   Food Insecurity:   . Worried About Charity fundraiser in the Last Year:   . Arboriculturist in the Last Year:   Transportation Needs:   . Film/video editor (Medical):   Marland Kitchen Lack of Transportation (Non-Medical):   Physical Activity:   . Days of Exercise per Week:   . Minutes of Exercise per Session:   Stress:   . Feeling of Stress :   Social Connections:   . Frequency of Communication with Friends and Family:   . Frequency of Social Gatherings with Friends and Family:   . Attends Religious Services:   . Active Member of Clubs or Organizations:   . Attends Archivist Meetings:   Marland Kitchen Marital Status:     Family History  Problem Relation Age of Onset  . Other Paternal Grandfather        has a pacemaker  . Cancer Maternal Grandmother        cervical  . Depression Maternal Grandmother   . Cancer Maternal Grandfather        lung that went to brain  . Diabetes Mother   . Arthritis Mother   . Other Mother        auto immune disease  . Depression Mother

## 2019-04-27 NOTE — Patient Instructions (Signed)
SANDIA PFUND  04/27/2019     @PREFPERIOPPHARMACY @   Your procedure is scheduled on  04/30/2019   Report to [facility] at  1100  A.M.  Call this number if you have problems the morning of surgery:  [phone]   Remember:  Do not eat or drink after midnight.                        Take these medicines the morning of surgery with A SIP OF WATER  None    Do not wear jewelry, make-up or nail polish.  Do not wear lotions, powders, or perfumes. Please wear deodorant and brush your teeth.  Do not shave 48 hours prior to surgery.  Men may shave face and neck.  Do not bring valuables to the hospital.  [facility] is not responsible for any belongings or valuables.  Contacts, dentures or bridgework may not be worn into surgery.  Leave your suitcase in the car.  After surgery it may be brought to your room.  For patients admitted to the hospital, discharge time will be determined by your treatment team.  Patients discharged the day of surgery will not be allowed to drive home.   Name and phone number of your driver:   family Special instructions:  None  Please read over the following fact sheets that you were given. Anesthesia Post-op Instructions and Care and Recovery After Surgery       General Anesthesia, Adult, Care After This sheet gives you information about how to care for yourself after your procedure. Your health care provider may also give you more specific instructions. If you have problems or questions, contact your health care provider. What can I expect after the procedure? After the procedure, the following side effects are common:  Pain or discomfort at the IV site.  Nausea.  Vomiting.  Sore throat.  Trouble concentrating.  Feeling cold or chills.  Weak or tired.  Sleepiness and fatigue.  Soreness and body aches. These side effects can affect parts of the body that were not involved in surgery. Follow these instructions at home:  For at  least 24 hours after the procedure:  Have a responsible adult stay with you. It is important to have someone help care for you until you are awake and alert.  Rest as needed.  Do not: ? Participate in activities in which you could fall or become injured. ? Drive. ? Use heavy machinery. ? Drink alcohol. ? Take sleeping pills or medicines that cause drowsiness. ? Make important decisions or sign legal documents. ? Take care of children on your own. Eating and drinking  Follow any instructions from your health care provider about eating or drinking restrictions.  When you feel hungry, start by eating small amounts of foods that are soft and easy to digest (bland), such as toast. Gradually return to your regular diet.  Drink enough fluid to keep your urine pale yellow.  If you vomit, rehydrate by drinking water, juice, or clear broth. General instructions  If you have sleep apnea, surgery and certain medicines can increase your risk for breathing problems. Follow instructions from your health care provider about wearing your sleep device: ? Anytime you are sleeping, including during daytime naps. ? While taking prescription pain medicines, sleeping medicines, or medicines that make you drowsy.  Return to your normal activities as told by your health care provider. Ask your health care provider what activities  are safe for you.  Take over-the-counter and prescription medicines only as told by your health care provider.  If you smoke, do not smoke without supervision.  Keep all follow-up visits as told by your health care provider. This is important. Contact a health care provider if:  You have nausea or vomiting that does not get better with medicine.  You cannot eat or drink without vomiting.  You have pain that does not get better with medicine.  You are unable to pass urine.  You develop a skin rash.  You have a fever.  You have redness around your IV site that gets  worse. Get help right away if:  You have difficulty breathing.  You have chest pain.  You have blood in your urine or stool, or you vomit blood. Summary  After the procedure, it is common to have a sore throat or nausea. It is also common to feel tired.  Have a responsible adult stay with you for the first 24 hours after general anesthesia. It is important to have someone help care for you until you are awake and alert.  When you feel hungry, start by eating small amounts of foods that are soft and easy to digest (bland), such as toast. Gradually return to your regular diet.  Drink enough fluid to keep your urine pale yellow.  Return to your normal activities as told by your health care provider. Ask your health care provider what activities are safe for you. This information is not intended to replace advice given to you by your health care provider. Make sure you discuss any questions you have with your health care provider. Document Revised: 12/21/2016 Document Reviewed: 08/03/2016 Elsevier Patient Education  2020 [employer]. How to Use Chlorhexidine for Bathing Chlorhexidine gluconate (CHG) is a germ-killing (antiseptic) solution that is used to clean the skin. It can get rid of the bacteria that normally live on the skin and can keep them away for about 24 hours. To clean your skin with CHG, you may be given:  A CHG solution to use in the shower or as part of a sponge bath.  A prepackaged cloth that contains CHG. Cleaning your skin with CHG may help lower the risk for infection:  While you are staying in the intensive care unit of the hospital.  If you have a vascular access, such as a central line, to provide short-term or long-term access to your veins.  If you have a catheter to drain urine from your bladder.  If you are on a ventilator. A ventilator is a machine that helps you breathe by moving air in and out of your lungs.  After surgery. What are the risks? Risks  of using CHG include:  A skin reaction.  Hearing loss, if CHG gets in your ears.  Eye injury, if CHG gets in your eyes and is not rinsed out.  The CHG product catching fire. Make sure that you avoid smoking and flames after applying CHG to your skin. Do not use CHG:  If you have a chlorhexidine allergy or have previously reacted to chlorhexidine.  On babies younger than 25 months of age. How to use CHG solution  Use CHG only as told by your health care provider, and follow the instructions on the label.  Use the full amount of CHG as directed. Usually, this is one bottle. During a shower Follow these steps when using CHG solution during a shower (unless your health care provider gives you different  instructions): 1. Start the shower. 2. Use your normal soap and shampoo to wash your face and hair. 3. Turn off the shower or move out of the shower stream. 4. Pour the CHG onto a clean washcloth. Do not use any type of brush or rough-edged sponge. 5. Starting at your neck, lather your body down to your toes. Make sure you follow these instructions: ? If you will be having surgery, pay special attention to the part of your body where you will be having surgery. Scrub this area for at least 1 minute. ? Do not use CHG on your head or face. If the solution gets into your ears or eyes, rinse them well with water. ? Avoid your genital area. ? Avoid any areas of skin that have broken skin, cuts, or scrapes. ? Scrub your back and under your arms. Make sure to wash skin folds. 6. Let the lather sit on your skin for 1-2 minutes or as long as told by your health care provider. 7. Thoroughly rinse your entire body in the shower. Make sure that all body creases and crevices are rinsed well. 8. Dry off with a clean towel. Do not put any substances on your body afterward-such as powder, lotion, or perfume-unless you are told to do so by your health care provider. Only use lotions that are recommended by  the manufacturer. 9. Put on clean clothes or pajamas. 10. If it is the night before your surgery, sleep in clean sheets.  During a sponge bath Follow these steps when using CHG solution during a sponge bath (unless your health care provider gives you different instructions): 1. Use your normal soap and shampoo to wash your face and hair. 2. Pour the CHG onto a clean washcloth. 3. Starting at your neck, lather your body down to your toes. Make sure you follow these instructions: ? If you will be having surgery, pay special attention to the part of your body where you will be having surgery. Scrub this area for at least 1 minute. ? Do not use CHG on your head or face. If the solution gets into your ears or eyes, rinse them well with water. ? Avoid your genital area. ? Avoid any areas of skin that have broken skin, cuts, or scrapes. ? Scrub your back and under your arms. Make sure to wash skin folds. 4. Let the lather sit on your skin for 1-2 minutes or as long as told by your health care provider. 5. Using a different clean, wet washcloth, thoroughly rinse your entire body. Make sure that all body creases and crevices are rinsed well. 6. Dry off with a clean towel. Do not put any substances on your body afterward-such as powder, lotion, or perfume-unless you are told to do so by your health care provider. Only use lotions that are recommended by the manufacturer. 7. Put on clean clothes or pajamas. 8. If it is the night before your surgery, sleep in clean sheets. How to use CHG prepackaged cloths  Only use CHG cloths as told by your health care provider, and follow the instructions on the label.  Use the CHG cloth on clean, dry skin.  Do not use the CHG cloth on your head or face unless your health care provider tells you to.  When washing with the CHG cloth: ? Avoid your genital area. ? Avoid any areas of skin that have broken skin, cuts, or scrapes. Before surgery Follow these steps  when using a CHG cloth  to clean before surgery (unless your health care provider gives you different instructions): 1. Using the CHG cloth, vigorously scrub the part of your body where you will be having surgery. Scrub using a back-and-forth motion for 3 minutes. The area on your body should be completely wet with CHG when you are done scrubbing. 2. Do not rinse. Discard the cloth and let the area air-dry. Do not put any substances on the area afterward, such as powder, lotion, or perfume. 3. Put on clean clothes or pajamas. 4. If it is the night before your surgery, sleep in clean sheets.  For general bathing Follow these steps when using CHG cloths for general bathing (unless your health care provider gives you different instructions). 1. Use a separate CHG cloth for each area of your body. Make sure you wash between any folds of skin and between your fingers and toes. Wash your body in the following order, switching to a new cloth after each step: ? The front of your neck, shoulders, and chest. ? Both of your arms, under your arms, and your hands. ? Your stomach and groin area, avoiding the genitals. ? Your right leg and foot. ? Your left leg and foot. ? The back of your neck, your back, and your buttocks. 2. Do not rinse. Discard the cloth and let the area air-dry. Do not put any substances on your body afterward-such as powder, lotion, or perfume-unless you are told to do so by your health care provider. Only use lotions that are recommended by the manufacturer. 3. Put on clean clothes or pajamas. Contact a health care provider if:  Your skin gets irritated after scrubbing.  You have questions about using your solution or cloth. Get help right away if:  Your eyes become very red or swollen.  Your eyes itch badly.  Your skin itches badly and is red or swollen.  Your hearing changes.  You have trouble seeing.  You have swelling or tingling in your mouth or throat.  You have  trouble breathing.  You swallow any chlorhexidine. Summary  Chlorhexidine gluconate (CHG) is a germ-killing (antiseptic) solution that is used to clean the skin. Cleaning your skin with CHG may help to lower your risk for infection.  You may be given CHG to use for bathing. It may be in a bottle or in a prepackaged cloth to use on your skin. Carefully follow your health care provider's instructions and the instructions on the product label.  Do not use CHG if you have a chlorhexidine allergy.  Contact your health care provider if your skin gets irritated after scrubbing. This information is not intended to replace advice given to you by your health care provider. Make sure you discuss any questions you have with your health care provider. Document Revised: 03/06/2018 Document Reviewed: 11/15/2016 Elsevier Patient Education  2020 [employer].

## 2019-04-28 ENCOUNTER — Encounter (HOSPITAL_COMMUNITY)
Admission: RE | Admit: 2019-04-28 | Discharge: 2019-04-28 | Disposition: A | Discharge status: Home / Self Care | Payer: Federal, State, Local not specified - PPO | Source: Ambulatory Visit | Attending: Obstetrics & Gynecology | Admitting: Obstetrics & Gynecology

## 2019-04-28 ENCOUNTER — Encounter (HOSPITAL_COMMUNITY): Payer: Self-pay

## 2019-04-28 ENCOUNTER — Other Ambulatory Visit: Payer: Self-pay | Admitting: Obstetrics & Gynecology

## 2019-04-28 ENCOUNTER — Other Ambulatory Visit (HOSPITAL_COMMUNITY)
Admission: RE | Admit: 2019-04-28 | Discharge: 2019-04-28 | Disposition: A | Discharge status: Home / Self Care | Payer: Federal, State, Local not specified - PPO | Source: Ambulatory Visit | Attending: Obstetrics & Gynecology | Admitting: Obstetrics & Gynecology

## 2019-04-28 ENCOUNTER — Other Ambulatory Visit: Payer: Self-pay

## 2019-04-28 DIAGNOSIS — O021 Missed abortion: Secondary | ICD-10-CM | POA: Insufficient documentation

## 2019-04-28 DIAGNOSIS — Z3A00 Weeks of gestation of pregnancy not specified: Secondary | ICD-10-CM | POA: Insufficient documentation

## 2019-04-28 DIAGNOSIS — Z01812 Encounter for preprocedural laboratory examination: Secondary | ICD-10-CM | POA: Diagnosis not present

## 2019-04-28 DIAGNOSIS — Z20822 Contact with and (suspected) exposure to covid-19: Secondary | ICD-10-CM | POA: Insufficient documentation

## 2019-04-28 LAB — COMPREHENSIVE METABOLIC PANEL
ALT: 28 U/L (ref 0–44)
AST: 22 U/L (ref 15–41)
Albumin: 4 g/dL (ref 3.5–5.0)
Alkaline Phosphatase: 54 U/L (ref 38–126)
Anion gap: 10 (ref 5–15)
BUN: 7 mg/dL (ref 6–20)
CO2: 22 mmol/L (ref 22–32)
Calcium: 8.8 mg/dL — ABNORMAL LOW (ref 8.9–10.3)
Chloride: 105 mmol/L (ref 98–111)
Creatinine, Ser: 0.6 mg/dL (ref 0.44–1.00)
GFR calc Af Amer: >60 mL/min (ref >60)
GFR calc non Af Amer: >60 mL/min (ref >60)
Glucose, Bld: 91 mg/dL (ref 70–99)
Potassium: 3.5 mmol/L (ref 3.5–5.1)
Sodium: 137 mmol/L (ref 135–145)
Total Bilirubin: 0.7 mg/dL (ref 0.3–1.2)
Total Protein: 7.2 g/dL (ref 6.5–8.1)

## 2019-04-28 LAB — CBC
HCT: 37.5 % (ref 36.0–46.0)
Hemoglobin: 12.1 g/dL (ref 12.0–15.0)
MCH: 26.9 pg (ref 26.0–34.0)
MCHC: 32.3 g/dL (ref 30.0–36.0)
MCV: 83.5 fL (ref 80.0–100.0)
Platelets: 365 10*3/uL (ref 150–400)
RBC: 4.49 MIL/uL (ref 3.87–5.11)
RDW: 14.5 % (ref 11.5–15.5)
WBC: 7.2 10*3/uL (ref 4.0–10.5)
nRBC: 0 % (ref 0.0–0.2)

## 2019-04-28 LAB — TYPE AND SCREEN
ABO/RH(D): O POS
Antibody Screen: NEGATIVE

## 2019-04-28 LAB — URINALYSIS, ROUTINE W REFLEX MICROSCOPIC
Bilirubin Urine: NEGATIVE
Glucose, UA: NEGATIVE mg/dL
Ketones, ur: NEGATIVE mg/dL
Nitrite: NEGATIVE
Protein, ur: NEGATIVE mg/dL
Specific Gravity, Urine: 1.023 (ref 1.005–1.030)
pH: 5 (ref 5.0–8.0)

## 2019-04-28 LAB — HEMOGLOBIN A1C
Hgb A1c MFr Bld: 5 % (ref 4.8–5.6)
Mean Plasma Glucose: 96.8 mg/dL

## 2019-04-29 LAB — SARS CORONAVIRUS 2 (TAT 6-24 HRS): SARS Coronavirus 2: NEGATIVE

## 2019-04-30 ENCOUNTER — Encounter (HOSPITAL_COMMUNITY)
Admission: RE | Disposition: A | Discharge status: Home / Self Care | Payer: Self-pay | Source: Home / Self Care | Attending: Obstetrics & Gynecology

## 2019-04-30 ENCOUNTER — Encounter (HOSPITAL_COMMUNITY): Payer: Self-pay | Admitting: Obstetrics & Gynecology

## 2019-04-30 ENCOUNTER — Other Ambulatory Visit: Payer: Self-pay

## 2019-04-30 ENCOUNTER — Ambulatory Visit (HOSPITAL_COMMUNITY)
Admission: RE | Admit: 2019-04-30 | Discharge: 2019-04-30 | Disposition: A | Discharge status: Home / Self Care | Payer: Federal, State, Local not specified - PPO | Attending: Obstetrics & Gynecology | Admitting: Obstetrics & Gynecology

## 2019-04-30 ENCOUNTER — Ambulatory Visit (HOSPITAL_COMMUNITY): Payer: Federal, State, Local not specified - PPO | Admitting: Anesthesiology

## 2019-04-30 DIAGNOSIS — I7300 Raynaud's syndrome without gangrene: Secondary | ICD-10-CM | POA: Diagnosis not present

## 2019-04-30 DIAGNOSIS — Z833 Family history of diabetes mellitus: Secondary | ICD-10-CM | POA: Diagnosis not present

## 2019-04-30 DIAGNOSIS — O021 Missed abortion: Secondary | ICD-10-CM | POA: Insufficient documentation

## 2019-04-30 DIAGNOSIS — O24415 Gestational diabetes mellitus in pregnancy, controlled by oral hypoglycemic drugs: Secondary | ICD-10-CM | POA: Diagnosis not present

## 2019-04-30 DIAGNOSIS — Z3A01 Less than 8 weeks gestation of pregnancy: Secondary | ICD-10-CM | POA: Diagnosis not present

## 2019-04-30 DIAGNOSIS — O99411 Diseases of the circulatory system complicating pregnancy, first trimester: Secondary | ICD-10-CM | POA: Insufficient documentation

## 2019-04-30 HISTORY — PX: DILATION AND CURETTAGE OF UTERUS: SHX78

## 2019-04-30 SURGERY — DILATION AND CURETTAGE
Anesthesia: General | Site: Vagina

## 2019-04-30 MED ORDER — KETOROLAC TROMETHAMINE 10 MG PO TABS
10.0000 mg | ORAL_TABLET | Freq: Three times a day (TID) | ORAL | 0 refills | Status: DC | PRN
Start: 2019-04-30 — End: 2019-05-18

## 2019-04-30 MED ORDER — MIDAZOLAM HCL 2 MG/2ML IJ SOLN
2.0000 mg | Freq: Once | INTRAMUSCULAR | Status: AC
  Administered 2019-04-30: 2 mg via INTRAVENOUS

## 2019-04-30 MED ORDER — LACTATED RINGERS IV SOLN: INTRAVENOUS | Status: DC | PRN

## 2019-04-30 MED ORDER — CEFAZOLIN SODIUM-DEXTROSE 2-4 GM/100ML-% IV SOLN
INTRAVENOUS | Status: AC
  Filled 2019-04-30: qty 100

## 2019-04-30 MED ORDER — OXYTOCIN 10 UNIT/ML IJ SOLN
INTRAMUSCULAR | Status: AC
  Filled 2019-04-30: qty 2

## 2019-04-30 MED ORDER — MIDAZOLAM HCL 2 MG/2ML IJ SOLN
INTRAMUSCULAR | Status: AC
  Filled 2019-04-30: qty 2

## 2019-04-30 MED ORDER — HYDROMORPHONE HCL 1 MG/ML IJ SOLN: 0.2500 mg | INTRAMUSCULAR | Status: DC | PRN

## 2019-04-30 MED ORDER — MEPERIDINE HCL 50 MG/ML IJ SOLN: 6.2500 mg | INTRAMUSCULAR | Status: DC | PRN

## 2019-04-30 MED ORDER — FENTANYL CITRATE (PF) 250 MCG/5ML IJ SOLN
INTRAMUSCULAR | Status: AC
  Filled 2019-04-30: qty 5

## 2019-04-30 MED ORDER — LIDOCAINE 2% (20 MG/ML) 5 ML SYRINGE
INTRAMUSCULAR | Status: AC
  Filled 2019-04-30: qty 5

## 2019-04-30 MED ORDER — LIDOCAINE HCL (CARDIAC) PF 100 MG/5ML IV SOSY
PREFILLED_SYRINGE | INTRAVENOUS | Status: DC | PRN
  Administered 2019-04-30: 80 mg via INTRAVENOUS

## 2019-04-30 MED ORDER — HYDROCODONE-ACETAMINOPHEN 5-325 MG PO TABS
1.0000 | ORAL_TABLET | Freq: Four times a day (QID) | ORAL | Status: AC | PRN
  Administered 2019-04-30: 1 via ORAL

## 2019-04-30 MED ORDER — METHYLERGONOVINE MALEATE 0.2 MG/ML IJ SOLN
INTRAMUSCULAR | Status: AC
  Filled 2019-04-30: qty 1

## 2019-04-30 MED ORDER — CEFAZOLIN SODIUM-DEXTROSE 2-4 GM/100ML-% IV SOLN
2.0000 g | INTRAVENOUS | Status: AC
  Administered 2019-04-30: 2 g via INTRAVENOUS

## 2019-04-30 MED ORDER — METHYLERGONOVINE MALEATE 0.2 MG/ML IJ SOLN
INTRAMUSCULAR | Status: DC | PRN
Start: 2019-04-30 — End: 2019-04-30
  Administered 2019-04-30: .2 mg via INTRAMUSCULAR

## 2019-04-30 MED ORDER — ONDANSETRON HCL 4 MG/2ML IJ SOLN
INTRAMUSCULAR | Status: DC | PRN
  Administered 2019-04-30: 4 mg via INTRAVENOUS

## 2019-04-30 MED ORDER — PROMETHAZINE HCL 25 MG/ML IJ SOLN: 6.2500 mg | INTRAMUSCULAR | Status: DC | PRN

## 2019-04-30 MED ORDER — 0.9 % SODIUM CHLORIDE (POUR BTL) OPTIME
TOPICAL | Status: DC | PRN
  Administered 2019-04-30: 1000 mL

## 2019-04-30 MED ORDER — ONDANSETRON 8 MG PO TBDP
8.0000 mg | ORAL_TABLET | Freq: Three times a day (TID) | ORAL | 0 refills | Status: DC | PRN
Start: 2019-04-30 — End: 2019-05-18

## 2019-04-30 MED ORDER — KETOROLAC TROMETHAMINE 30 MG/ML IJ SOLN
INTRAMUSCULAR | Status: AC
  Filled 2019-04-30: qty 1

## 2019-04-30 MED ORDER — HYDROCODONE-ACETAMINOPHEN 5-325 MG PO TABS
ORAL_TABLET | ORAL | Status: AC
  Filled 2019-04-30: qty 1

## 2019-04-30 MED ORDER — KETOROLAC TROMETHAMINE 30 MG/ML IJ SOLN
30.0000 mg | Freq: Once | INTRAMUSCULAR | Status: AC
  Administered 2019-04-30: 30 mg via INTRAVENOUS

## 2019-04-30 MED ORDER — LACTATED RINGERS IV SOLN: Freq: Once | INTRAVENOUS | Status: AC

## 2019-04-30 MED ORDER — DEXAMETHASONE SODIUM PHOSPHATE 10 MG/ML IJ SOLN
INTRAMUSCULAR | Status: DC | PRN
  Administered 2019-04-30: 10 mg via INTRAVENOUS

## 2019-04-30 MED ORDER — PROPOFOL 10 MG/ML IV BOLUS
INTRAVENOUS | Status: DC | PRN
  Administered 2019-04-30: 200 mg via INTRAVENOUS

## 2019-04-30 MED ORDER — FENTANYL CITRATE (PF) 100 MCG/2ML IJ SOLN
INTRAMUSCULAR | Status: DC | PRN
  Administered 2019-04-30: 25 ug via INTRAVENOUS
  Administered 2019-04-30: 50 ug via INTRAVENOUS
  Administered 2019-04-30: 25 ug via INTRAVENOUS

## 2019-04-30 MED ORDER — FENTANYL CITRATE (PF) 100 MCG/2ML IJ SOLN
INTRAMUSCULAR | Status: AC
  Filled 2019-04-30: qty 2

## 2019-04-30 MED ORDER — PROPOFOL 10 MG/ML IV BOLUS
INTRAVENOUS | Status: AC
  Filled 2019-04-30: qty 40

## 2019-04-30 SURGICAL SUPPLY — 28 items
BAG HAMPER (MISCELLANEOUS) ×2 IMPLANT
CATH ROBINSON RED A/P 14FR (CATHETERS) IMPLANT
CLOTH BEACON ORANGE TIMEOUT ST (SAFETY) ×2 IMPLANT
COVER LIGHT HANDLE STERIS (MISCELLANEOUS) ×4 IMPLANT
COVER WAND RF STERILE (DRAPES) ×4 IMPLANT
GAUZE 4X4 16PLY RFD (DISPOSABLE) ×4 IMPLANT
GLOVE BIO SURGEON STRL SZ7 (GLOVE) ×1 IMPLANT
GLOVE BIOGEL PI IND STRL 7.0 (GLOVE) ×2 IMPLANT
GLOVE BIOGEL PI IND STRL 8 (GLOVE) ×1 IMPLANT
GLOVE BIOGEL PI INDICATOR 7.0 (GLOVE) ×2
GLOVE BIOGEL PI INDICATOR 8 (GLOVE) ×1
GLOVE ECLIPSE 8.0 STRL XLNG CF (GLOVE) ×2 IMPLANT
GOWN STRL REUS W/TWL LRG LVL3 (GOWN DISPOSABLE) ×2 IMPLANT
GOWN STRL REUS W/TWL XL LVL3 (GOWN DISPOSABLE) ×2 IMPLANT
KIT BERKELEY 1ST TRIMESTER 3/8 (MISCELLANEOUS) ×2 IMPLANT
KIT TURNOVER CYSTO (KITS) ×2 IMPLANT
MANIFOLD NEPTUNE II (INSTRUMENTS) ×2 IMPLANT
MARKER SKIN DUAL TIP RULER LAB (MISCELLANEOUS) ×2 IMPLANT
NS IRRIG 1000ML POUR BTL (IV SOLUTION) ×2 IMPLANT
PACK BASIC III (CUSTOM PROCEDURE TRAY) ×2
PACK SRG BSC III STRL LF ECLPS (CUSTOM PROCEDURE TRAY) ×1 IMPLANT
PAD ARMBOARD 7.5X6 YLW CONV (MISCELLANEOUS) ×2 IMPLANT
SET BASIN LINEN APH (SET/KITS/TRAYS/PACK) ×2 IMPLANT
SET BERKELEY SUCTION TUBING (SUCTIONS) ×2 IMPLANT
SHEET LAVH (DRAPES) ×2 IMPLANT
TOWEL OR 17X26 4PK STRL BLUE (TOWEL DISPOSABLE) IMPLANT
VACURETTE 10MM (CANNULA) ×1 IMPLANT
VACURETTE 8MM (CANNULA) ×1 IMPLANT

## 2019-04-30 NOTE — Anesthesia Procedure Notes (Signed)
Procedure Name: LMA Insertion Date/Time: 04/30/2019 12:27 PM Performed by: Elgie Congo, CRNA Pre-anesthesia Checklist: Patient identified, Emergency Drugs available, Suction available and Patient being monitored Patient Re-evaluated:Patient Re-evaluated prior to induction Oxygen Delivery Method: Circle system utilized Preoxygenation: Pre-oxygenation with 100% oxygen Induction Type: IV induction LMA: LMA inserted LMA Size: 4.0 Number of attempts: 1 Placement Confirmation: positive ETCO2 and breath sounds checked- equal and bilateral Tube secured with: Tape Dental Injury: Teeth and Oropharynx as per pre-operative assessment

## 2019-04-30 NOTE — Transfer of Care (Signed)
Immediate Anesthesia Transfer of Care Note  Patient: Rebecca Gibbs  Procedure(s) Performed: SUCTION DILATATION AND CURETTAGE (N/A Vagina )  Patient Location: PACU  Anesthesia Type:General  Level of Consciousness: awake and alert   Airway & Oxygen Therapy: Patient Spontanous Breathing  Post-op Assessment: Report given to RN and Post -op Vital signs reviewed and stable  Post vital signs: Reviewed and stable  Last Vitals:  Vitals Value Taken Time  BP 105/63 04/30/19 1315  Temp    Pulse 100 04/30/19 1316  Resp 19 04/30/19 1316  SpO2 99 % 04/30/19 1316  Vitals shown include unvalidated device data.  Last Pain:  Vitals:   04/30/19 1157  TempSrc: Oral  PainSc: 0-No pain      Patients Stated Pain Goal: 5 (04/30/19 1157)  Complications: No apparent anesthesia complications

## 2019-04-30 NOTE — Anesthesia Preprocedure Evaluation (Signed)
Anesthesia Evaluation  Patient identified by MRN, date of birth, ID band Patient awake    Reviewed: Allergy & Precautions, NPO status , Patient's Chart, lab work & pertinent test results  Airway Mallampati: II  TM Distance: >3 FB Neck ROM: Full    Dental  (+) Teeth Intact   Pulmonary neg pulmonary ROS,    Pulmonary exam normal breath sounds clear to auscultation       Cardiovascular Exercise Tolerance: Good Normal cardiovascular exam Rhythm:Regular Rate:Normal     Neuro/Psych PSYCHIATRIC DISORDERS Depression negative neurological ROS     GI/Hepatic negative GI ROS, Neg liver ROS,   Endo/Other  diabetes (not on meds), Gestational  Renal/GU negative Renal ROS     Musculoskeletal negative musculoskeletal ROS (+)   Abdominal   Peds  Hematology negative hematology ROS (+)   Anesthesia Other Findings   Reproductive/Obstetrics negative OB ROS                             Anesthesia Physical Anesthesia Plan  ASA: II  Anesthesia Plan: General   Post-op Pain Management:    Induction: Intravenous  PONV Risk Score and Plan: 4 or greater and Ondansetron, Dexamethasone and Midazolam  Airway Management Planned: LMA  Additional Equipment:   Intra-op Plan:   Post-operative Plan:   Informed Consent: I have reviewed the patients History and Physical, chart, labs and discussed the procedure including the risks, benefits and alternatives for the proposed anesthesia with the patient or authorized representative who has indicated his/her understanding and acceptance.     Dental advisory given  Plan Discussed with: CRNA and Surgeon  Anesthesia Plan Comments:         Anesthesia Quick Evaluation

## 2019-04-30 NOTE — H&P (Signed)
Preoperative History and Physical  Rebecca Gibbs is a 24 y.o. G3P1001 with Patient's last menstrual period was 02/16/2019. admitted for a suction/sharp D&C for surgical management of missed ab in the first trimester.  See sonogram reports below Pt declined cytotec and opted for surgical management  PMH:    Past Medical History:  Diagnosis Date  . Diabetes mellitus without complication (HCC)    Gestational DM, on Glyburide  . Pregnant 01/07/2014  . Raynaud's disease     PSH:     Past Surgical History:  Procedure Laterality Date  . CHOLECYSTECTOMY N/A 05/19/2018   Procedure: LAPAROSCOPIC CHOLECYSTECTOMY;  Surgeon: [name], MD;  Location: [facility];  Service: General;  Laterality: N/A;  . TONSILLECTOMY      POb/GynH:      OB History    Gravida  3   Para  1   Term  1   Preterm      AB      Living  1     SAB      TAB      Ectopic      Multiple  0   Live Births  1           SH:   Social History   Tobacco Use  . Smoking status: Never Smoker  . Smokeless tobacco: Never Used  Substance Use Topics  . Alcohol use: No  . Drug use: No    FH:    Family History  Problem Relation Age of Onset  . Other Paternal Grandfather        has a pacemaker  . Cancer Maternal Grandmother        cervical  . Depression Maternal Grandmother   . Cancer Maternal Grandfather        lung that went to brain  . Diabetes Mother   . Arthritis Mother   . Other Mother        auto immune disease  . Depression Mother      Allergies: No Known Allergies  Medications:       Current Facility-Administered Medications:  .  0.9 % irrigation (POUR BTL), , , PRN, Lazaro Arms, MD, 1,000 mL at 04/30/19 1203 .  [START ON 05/01/2019] ceFAZolin (ANCEF) IVPB 2g/100 mL premix, 2 g, Intravenous, On Call to OR, Lazaro Arms, MD .  ketorolac (TORADOL) 30 MG/ML injection 30 mg, 30 mg, Intravenous, Once, Lazaro Arms, MD .  lactated ringers infusion, , Intravenous, Once,  Battula, Rajamani C, MD .  midazolam (VERSED) injection 2 mg, 2 mg, Intravenous, Once, Battula, Rajamani C, MD .  midazolam (VERSED) injection 2 mg, 2 mg, Intravenous, Once, Battula, Rajamani C, MD  Review of Systems:   Review of Systems  Constitutional: Negative for fever, chills, weight loss, malaise/fatigue and diaphoresis.  HENT: Negative for hearing loss, ear pain, nosebleeds, congestion, sore throat, neck pain, tinnitus and ear discharge.   Eyes: Negative for blurred vision, double vision, photophobia, pain, discharge and redness.  Respiratory: Negative for cough, hemoptysis, sputum production, shortness of breath, wheezing and stridor.   Cardiovascular: Negative for chest pain, palpitations, orthopnea, claudication, leg swelling and PND.  Gastrointestinal: Positive for abdominal pain. Negative for heartburn, nausea, vomiting, diarrhea, constipation, blood in stool and melena.  Genitourinary: Negative for dysuria, urgency, frequency, hematuria and flank pain.  Musculoskeletal: Negative for myalgias, back pain, joint pain and falls.  Skin: Negative for itching and rash.  Neurological: Negative for dizziness, tingling, tremors, sensory change,  speech change, focal weakness, seizures, loss of consciousness, weakness and headaches.  Endo/Heme/Allergies: Negative for environmental allergies and polydipsia. Does not bruise/bleed easily.  Psychiatric/Behavioral: Negative for depression, suicidal ideas, hallucinations, memory loss and substance abuse. The patient is not nervous/anxious and does not have insomnia.      PHYSICAL EXAM:  Pulse 81, temperature 98.3 F (36.8 C), temperature source Oral, resp. rate 12, height 5\' 5"  (1.651 m), weight 104.8 kg, last menstrual period 02/16/2019, SpO2 100 %.    Vitals reviewed. Constitutional: She is oriented to person, place, and time. She appears well-developed and well-nourished.  HENT:  Head: Normocephalic and atraumatic.  Right Ear: External  ear normal.  Left Ear: External ear normal.  Nose: Nose normal.  Mouth/Throat: Oropharynx is clear and moist.  Eyes: Conjunctivae and EOM are normal. Pupils are equal, round, and reactive to light. Right eye exhibits no discharge. Left eye exhibits no discharge. No scleral icterus.  Neck: Normal range of motion. Neck supple. No tracheal deviation present. No thyromegaly present.  Cardiovascular: Normal rate, regular rhythm, normal heart sounds and intact distal pulses.  Exam reveals no gallop and no friction rub.   No murmur heard. Respiratory: Effort normal and breath sounds normal. No respiratory distress. She has no wheezes. She has no rales. She exhibits no tenderness.  GI: Soft. Bowel sounds are normal. She exhibits no distension and no mass. There is tenderness. There is no rebound and no guarding.  Genitourinary:       Vulva is normal without lesions Vagina is pink moist without discharge Cervix normal in appearance and pap is normal Uterus is normal size, contour, position, consistency, mobility, non-tender Adnexa is negative with normal sized ovaries by sonogram  Musculoskeletal: Normal range of motion. She exhibits no edema and no tenderness.  Neurological: She is alert and oriented to person, place, and time. She has normal reflexes. She displays normal reflexes. No cranial nerve deficit. She exhibits normal muscle tone. Coordination normal.  Skin: Skin is warm and dry. No rash noted. No erythema. No pallor.  Psychiatric: She has a normal mood and affect. Her behavior is normal. Judgment and thought content normal.    Labs: Results for orders placed or performed during the hospital encounter of 04/28/19 (from the past 336 hour(s))  CBC   Collection Time: 04/28/19  3:27 PM  Result Value Ref Range   WBC 7.2 4.0 - 10.5 K/uL   RBC 4.49 3.87 - 5.11 MIL/uL   Hemoglobin 12.1 12.0 - 15.0 g/dL   HCT 04/30/19 80.9 - 98.3 %   MCV 83.5 80.0 - 100.0 fL   MCH 26.9 26.0 - 34.0 pg   MCHC 32.3  30.0 - 36.0 g/dL   RDW 38.2 50.5 - 39.7 %   Platelets 365 150 - 400 K/uL   nRBC 0.0 0.0 - 0.2 %  Comprehensive metabolic panel   Collection Time: 04/28/19  3:27 PM  Result Value Ref Range   Sodium 137 135 - 145 mmol/L   Potassium 3.5 3.5 - 5.1 mmol/L   Chloride 105 98 - 111 mmol/L   CO2 22 22 - 32 mmol/L   Glucose, Bld 91 70 - 99 mg/dL   BUN 7 6 - 20 mg/dL   Creatinine, Ser 04/30/19 0.44 - 1.00 mg/dL   Calcium 8.8 (L) 8.9 - 10.3 mg/dL   Total Protein 7.2 6.5 - 8.1 g/dL   Albumin 4.0 3.5 - 5.0 g/dL   AST 22 15 - 41 U/L   ALT 28 0 -  44 U/L   Alkaline Phosphatase 54 38 - 126 U/L   Total Bilirubin 0.7 0.3 - 1.2 mg/dL   GFR calc non Af Amer >60 >60 mL/min   GFR calc Af Amer >60 >60 mL/min   Anion gap 10 5 - 15  Urinalysis, Routine w reflex microscopic   Collection Time: 04/28/19  3:27 PM  Result Value Ref Range   Color, Urine YELLOW YELLOW   APPearance HAZY (A) CLEAR   Specific Gravity, Urine 1.023 1.005 - 1.030   pH 5.0 5.0 - 8.0   Glucose, UA NEGATIVE NEGATIVE mg/dL   Hgb urine dipstick SMALL (A) NEGATIVE   Bilirubin Urine NEGATIVE NEGATIVE   Ketones, ur NEGATIVE NEGATIVE mg/dL   Protein, ur NEGATIVE NEGATIVE mg/dL   Nitrite NEGATIVE NEGATIVE   Leukocytes,Ua TRACE (A) NEGATIVE   RBC / HPF 0-5 0 - 5 RBC/hpf   WBC, UA 0-5 0 - 5 WBC/hpf   Bacteria, UA RARE (A) NONE SEEN   Squamous Epithelial / LPF 11-20 0 - 5   Mucus PRESENT   Hemoglobin A1c   Collection Time: 04/28/19  3:27 PM  Result Value Ref Range   Hgb A1c MFr Bld 5.0 4.8 - 5.6 %   Mean Plasma Glucose 96.8 mg/dL  Type and screen   Collection Time: 04/28/19  3:27 PM  Result Value Ref Range   ABO/RH(D) O POS    Antibody Screen NEG    Sample Expiration      05/01/2019,2359 Performed at [facility], [address] [place], [place] [address]   Results for orders placed or performed during the hospital encounter of 04/28/19 (from the past 336 hour(s))  SARS CORONAVIRUS 2 (TAT 6-24 HRS) Nasopharyngeal Nasopharyngeal  Swab   Collection Time: 04/28/19  7:22 AM   Specimen: Nasopharyngeal Swab  Result Value Ref Range   SARS Coronavirus 2 NEGATIVE NEGATIVE    EKG: No orders found for this or any previous visit.  Imaging Studies: US OB Transvaginal  Result Date: 04/24/2019 FOLLOW UP VIABILITY SONOGRAM Baxter Kailrica D Willet is a 24 y.o. year old G3P1001 with LMP Patient's last menstrual period was 02/16/2019. which would correlate to  2625w6d weeks gestation.  She has regular menstrual cycles.   She is here today for a f/u viability sonogram. GESTATION: SINGLETON   FETAL ACTIVITY:          Heart rate         No FHT       CERVIX: Appears closed ADNEXA: The ovaries are normal. GESTATIONAL AGE AND  BIOMETRICS: Gestational criteria: Estimated Date of Delivery: 12/05/19 by early ultrasound now at 5725w6d Previous Scans:2 GESTATIONAL SAC           15.2 mm         6+2 weeks CROWN RUMP LENGTH           12.29 mm         7+3 weeks                                                                      AVERAGE EGA(BY THIS SCAN):  7+3 weeks  TECHNICIAN COMMENTS: US TV: 7+3 wks single IUP,no fetal heart tones visualized,GS 15.2 mm=6+2 wks,subchorionic hemorrhage 2.2 x 1.3 x .6 cm,normal  ovaries Chaperone Marchelle Folks A copy of this report including all images has been saved and backed up to a second source for retrieval if needed. All measures and details of the anatomical scan, placentation, fluid volume and pelvic anatomy are contained in that report. Amber Flora Lipps 04/24/2019 9:05 AM Clinical Impression and recommendations:Comparison study 04/14/19 I have reviewed the sonogram results above, combined with the patient's current clinical course, below are my impressions and any appropriate recommendations for management based on the sonographic findings. Non viable IUP, interval change from positive FCA to now no FCA G3P1001 Estimated Date of Delivery: 12/05/19 Normal general sonographic findings Recommend routine care unless otherwise clinically indicated  Lazaro Arms 04/24/2019 9:52 AM  US OB Transvaginal  Result Date: 04/16/2019 FOLLOW UP SONOGRAM AMORINA DOERR is in the office for a follow up sonogram for viability. She is a 24 y.o. year old G3P1001 with Estimated Date of Delivery: 12/05/19 by early ultrasound now at  [redacted]w[redacted]d weeks gestation. Thus far the pregnancy has been complicated by vaginal bleeding. GESTATION: SINGLETON FETAL ACTIVITY:          Heart rate         90 bpm,bradycardia    CERVIX: Appears closed ADNEXA: The ovaries are normal. GESTATIONAL AGE AND  BIOMETRICS: Gestational criteria: Estimated Date of Delivery: 12/05/19 by early ultrasound now at [redacted]w[redacted]d Previous Scans:1 GESTATIONAL SAC           9.5 mm         5+5 weeks CROWN RUMP LENGTH           7.10 mm         6+4 weeks                                                                      AVERAGE EGA(BY THIS SCAN):  6+4 weeks SUSPECTED ABNORMALITIES:  subchorionic hemorrhage 1.3 x 1.4 x .8 cm,GS size discrepancy,GS  9.5 mm=5+5 wks,FHR 90 bpm QUALITY OF SCAN: satisfactory TECHNICIAN COMMENTS: US TV:6+5 wks,single IUP with YS,FHR 90 bpm,GS size discrepancy,GS  9.5 mm=5+5 wks,subchorionic hemorrhage 1.3 x 1.4 x .8 cm A copy of this report including all images has been saved and backed up to a second source for retrieval if needed. All measures and details of the anatomical scan, placentation, fluid volume and pelvic anatomy are contained in that report. Amber Flora Lipps 04/16/2019 10:49 AM Clinical Impression and recommendations: I have reviewed the sonogram results above, combined with the patient's current clinical course, below are my impressions and any appropriate recommendations for management based on the sonographic findings. Viable early IUP, associated fetal bradycardia, which is of concern G3P1001 Estimated Date of Delivery: 12/05/19 early ultrasound Normal general sonographic findings Recommend routine care unless otherwise clinically indicated Lazaro Arms 04/16/2019 11:09 AM       Assessment: Missed Ab in the first trimester   (O positive blood type)  Patient Active Problem List   Diagnosis Date Noted  . Calculus of gallbladder without cholecystitis without obstruction 05/08/2018  . Post partum depression 07/28/2015  . Term pregnancy 08/31/2014  . Gestational diabetes mellitus, class A2 07/15/2014  . Pregnant 01/07/2014    Plan: Suction sharp uterine curettage for management of missed AB  Lazaro Arms  04/30/2019 12:07 PM

## 2019-04-30 NOTE — Anesthesia Postprocedure Evaluation (Signed)
Anesthesia Post Note  Patient: Rebecca Gibbs  Procedure(s) Performed: SUCTION DILATATION AND CURETTAGE (N/A Vagina )  Patient location during evaluation: PACU Anesthesia Type: General Level of consciousness: awake and alert Pain management: pain level controlled Vital Signs Assessment: post-procedure vital signs reviewed and stable Respiratory status: spontaneous breathing, nonlabored ventilation and respiratory function stable Cardiovascular status: stable Postop Assessment: no apparent nausea or vomiting Anesthetic complications: no     Last Vitals:  Vitals:   04/30/19 1157  Pulse: 81  Resp: 12  Temp: 36.8 C  SpO2: 100%    Last Pain:  Vitals:   04/30/19 1157  TempSrc: Oral  PainSc: 0-No pain                 Brittney Mucha Hristova

## 2019-04-30 NOTE — Op Note (Signed)
Preoperative diagnosis:  Missed AB in the first trimester  Postoperative diagnosis:  Same as above  Procedure:  Cervical dilation with suction and sharp uterine curettage  Surgeon:  Lazaro Arms  Anesthesia:  Laryngeal mask airway  Findings:  Pt had serial sonograms with cessation of fetal cardiac activity noted at [redacted]w[redacted]d ga  Description of operation:  The patient was taken to the operating room and placed in the supine position.  She underwent laryngeal mask airway general anesthesia.  The patient was placed in the dorsal lithotomy position.  The vagina was prepped and draped in the usual sterile fashion.  A Graves speculum was placed.  The anterior cervix was grasped with a single-tooth tenaculum.  The cervix was dilated serially with Hegar dilators.  A #8 curved suction curette was placed in the uterus.  The suction pressure was placed at 55 and several passes were made.  All of the intrauterine contents were removed.  The sharp curette was used x1 to feel uterine crie in all areas.  The patient was given Methergine 0.2 mg IV x1.  There was good hemostasis.  The patient was given Ancef and toradol preoperatively.  .  Estimated blood loss for the procedure was 100 cc.  The patient was awakened from anesthesia taken to the recovery room in good stable condition.  All counts were correct x3.  Lazaro Arms 04/30/2019 12:58 PM

## 2019-04-30 NOTE — Discharge Instructions (Signed)
Miscarriage A miscarriage is the loss of an unborn baby (fetus) before the 20th week of pregnancy. Most miscarriages happen during the first 3 months of pregnancy. Sometimes, a miscarriage can happen before a woman knows that she is pregnant. Having a miscarriage can be an emotional experience. If you have had a miscarriage, talk with your health care provider about any questions you may have about miscarrying, the grieving process, and your plans for future pregnancy. What are the causes? A miscarriage may be caused by:  Problems with the genes or chromosomes of the fetus. These problems make it impossible for the baby to develop normally. They are often the result of random errors that occur early in the development of the baby, and are not passed from parent to child (not inherited).  Infection of the cervix or uterus.  Conditions that affect hormone balance in the body.  Problems with the cervix, such as the cervix opening and thinning before pregnancy is at term (cervical insufficiency).  Problems with the uterus. These may include: ? A uterus with an abnormal shape. ? Fibroids in the uterus. ? Congenital abnormalities. These are problems that were present at birth.  Certain medical conditions.  Smoking, drinking alcohol, or using drugs.  Injury (trauma). In many cases, the cause of a miscarriage is not known. What are the signs or symptoms? Symptoms of this condition include:  Vaginal bleeding or spotting, with or without cramps or pain.  Pain or cramping in the abdomen or lower back.  Passing fluid, tissue, or blood clots from the vagina. How is this diagnosed? This condition may be diagnosed based on:  A physical exam.  Ultrasound.  Blood tests.  Urine tests. How is this treated? Treatment for a miscarriage is sometimes not necessary if you naturally pass all the tissue that was in your uterus. If necessary, this condition may be treated with:  Dilation and  curettage (D&C). This is a procedure in which the cervix is stretched open and the lining of the uterus (endometrium) is scraped. This is done only if tissue from the fetus or placenta remains in the body (incomplete miscarriage).  Medicines, such as: ? Antibiotic medicine, to treat infection. ? Medicine to help the body pass any remaining tissue. ? Medicine to reduce (contract) the size of the uterus. These medicines may be given if you have a lot of bleeding. If you have Rh negative blood and your baby was Rh positive, you will need a shot of a medicine called Rh immunoglobulinto protect your future babies from Rh blood problems. "Rh-negative" and "Rh-positive" refer to whether or not the blood has a specific protein found on the surface of red blood cells (Rh factor). Follow these instructions at home: Medicines   Take over-the-counter and prescription medicines only as told by your health care provider.  If you were prescribed antibiotic medicine, take it as told by your health care provider. Do not stop taking the antibiotic even if you start to feel better.  Do not take NSAIDs, such as aspirin and ibuprofen, unless they are approved by your health care provider. These medicines can cause bleeding. Activity  Rest as directed. Ask your health care provider what activities are safe for you.  Have someone help with home and family responsibilities during this time. General instructions  Keep track of the number of sanitary pads you use each day and how soaked (saturated) they are. Write down this information.  Monitor the amount of tissue or blood clots that   you pass from your vagina. Save any large amounts of tissue for your health care provider to examine.  Do not use tampons, douche, or have sex until your health care provider approves.  To help you and your partner with the process of grieving, talk with your health care provider or seek counseling.  When you are ready, meet with  your health care provider to discuss any important steps you should take for your health. Also, discuss steps you should take to have a healthy pregnancy in the future.  Keep all follow-up visits as told by your health care provider. This is important. Where to find more information  The American Congress of Obstetricians and Gynecologists: www.acog.org  U.S. Department of Health and Programmer, systems of Women's Health: [place] Contact a health care provider if:  You have a fever or chills.  You have a foul smelling vaginal discharge.  You have more bleeding instead of less. Get help right away if:  You have severe cramps or pain in your back or abdomen.  You pass blood clots or tissue from your vagina that is walnut-sized or larger.  You soak more than 1 regular sanitary pad in an hour.  You become light-headed or weak.  You pass out.  You have feelings of sadness that take over your thoughts, or you have thoughts of hurting yourself. Summary  Most miscarriages happen in the first 3 months of pregnancy. Sometimes miscarriage happens before a woman even knows that she is pregnant.  Follow your health care provider's instruction for home care. Keep all follow-up appointments.  To help you and your partner with the process of grieving, talk with your health care provider or seek counseling. This information is not intended to replace advice given to you by your health care provider. Make sure you discuss any questions you have with your health care provider. Document Revised: 04/11/2018 Document Reviewed: 01/24/2016 Elsevier Patient Education  2020 [place] Anesthesia, Adult, Care After This sheet gives you information about how to care for yourself after your procedure. Your health care provider may also give you more specific instructions. If you have problems or questions, contact your health care provider. What can I expect after the  procedure? After the procedure, the following side effects are common:  Pain or discomfort at the IV site.  Nausea.  Vomiting.  Sore throat.  Trouble concentrating.  Feeling cold or chills.  Weak or tired.  Sleepiness and fatigue.  Soreness and body aches. These side effects can affect parts of the body that were not involved in surgery. Follow these instructions at home:  For at least 24 hours after the procedure:  Have a responsible adult stay with you. It is important to have someone help care for you until you are awake and alert.  Rest as needed.  Do not: ? Participate in activities in which you could fall or become injured. ? Drive. ? Use heavy machinery. ? Drink alcohol. ? Take sleeping pills or medicines that cause drowsiness. ? Make important decisions or sign legal documents. ? Take care of children on your own. Eating and drinking  Follow any instructions from your health care provider about eating or drinking restrictions.  When you feel hungry, start by eating small amounts of foods that are soft and easy to digest (bland), such as toast. Gradually return to your regular diet.  Drink enough fluid to keep your urine pale yellow.  If you vomit, rehydrate  by drinking water, juice, or clear broth. General instructions  If you have sleep apnea, surgery and certain medicines can increase your risk for breathing problems. Follow instructions from your health care provider about wearing your sleep device: ? Anytime you are sleeping, including during daytime naps. ? While taking prescription pain medicines, sleeping medicines, or medicines that make you drowsy.  Return to your normal activities as told by your health care provider. Ask your health care provider what activities are safe for you.  Take over-the-counter and prescription medicines only as told by your health care provider.  If you smoke, do not smoke without supervision.  Keep all follow-up  visits as told by your health care provider. This is important. Contact a health care provider if:  You have nausea or vomiting that does not get better with medicine.  You cannot eat or drink without vomiting.  You have pain that does not get better with medicine.  You are unable to pass urine.  You develop a skin rash.  You have a fever.  You have redness around your IV site that gets worse. Get help right away if:  You have difficulty breathing.  You have chest pain.  You have blood in your urine or stool, or you vomit blood. Summary  After the procedure, it is common to have a sore throat or nausea. It is also common to feel tired.  Have a responsible adult stay with you for the first 24 hours after general anesthesia. It is important to have someone help care for you until you are awake and alert.  When you feel hungry, start by eating small amounts of foods that are soft and easy to digest (bland), such as toast. Gradually return to your regular diet.  Drink enough fluid to keep your urine pale yellow.  Return to your normal activities as told by your health care provider. Ask your health care provider what activities are safe for you. This information is not intended to replace advice given to you by your health care provider. Make sure you discuss any questions you have with your health care provider. Document Revised: 12/21/2016 Document Reviewed: 08/03/2016 Elsevier Patient Education  2020 [employer].

## 2019-05-01 LAB — SURGICAL PATHOLOGY

## 2019-05-04 NOTE — Addendum Note (Signed)
Addendum  created 05/04/19 1454 by Moshe Salisbury, CRNA   Charge Capture section accepted

## 2019-05-13 ENCOUNTER — Encounter: Payer: Federal, State, Local not specified - PPO | Admitting: Obstetrics and Gynecology

## 2019-05-18 ENCOUNTER — Encounter: Payer: Self-pay | Admitting: Obstetrics & Gynecology

## 2019-05-18 ENCOUNTER — Ambulatory Visit (INDEPENDENT_AMBULATORY_CARE_PROVIDER_SITE_OTHER): Payer: Federal, State, Local not specified - PPO | Admitting: Obstetrics & Gynecology

## 2019-05-18 VITALS — BP 129/75 | HR 88 | Ht 65.0 in | Wt 237.0 lb

## 2019-05-18 DIAGNOSIS — Z9889 Other specified postprocedural states: Secondary | ICD-10-CM

## 2019-05-18 NOTE — Progress Notes (Signed)
  HPI: Patient returns for routine postoperative follow-up having undergone D&C on 04/30/19 .  The patient's immediate postoperative recovery has been unremarkable. Since hospital discharge the patient reports bled for 3 days only.   No current outpatient medications on file. No current facility-administered medications for this visit.    Blood pressure 129/75, pulse 88, height 5\' 5"  (1.651 m), weight 237 lb (107.5 kg), last menstrual period 02/16/2019, unknown if currently breastfeeding.  Physical Exam: Normal post D&C exam  Uterus is involuted properly and non tender  Diagnostic Tests:   Pathology: Normal   Impression: S/p D&C for first trimester missed ab  Plan: Declines BCM at the present  Follow up: prn    02/18/2019, MD

## 2019-05-27 ENCOUNTER — Encounter: Payer: Federal, State, Local not specified - PPO | Admitting: Women's Health

## 2019-05-27 ENCOUNTER — Ambulatory Visit: Payer: Federal, State, Local not specified - PPO | Admitting: *Deleted

## 2019-05-27 ENCOUNTER — Other Ambulatory Visit: Payer: Federal, State, Local not specified - PPO

## 2019-06-05 DIAGNOSIS — F4321 Adjustment disorder with depressed mood: Secondary | ICD-10-CM | POA: Diagnosis not present

## 2019-06-05 DIAGNOSIS — R5383 Other fatigue: Secondary | ICD-10-CM | POA: Diagnosis not present

## 2019-06-16 DIAGNOSIS — R05 Cough: Secondary | ICD-10-CM | POA: Diagnosis not present

## 2019-06-16 DIAGNOSIS — J069 Acute upper respiratory infection, unspecified: Secondary | ICD-10-CM | POA: Diagnosis not present

## 2019-06-16 DIAGNOSIS — Z20828 Contact with and (suspected) exposure to other viral communicable diseases: Secondary | ICD-10-CM | POA: Diagnosis not present

## 2019-06-29 DIAGNOSIS — S8011XA Contusion of right lower leg, initial encounter: Secondary | ICD-10-CM | POA: Diagnosis not present

## 2019-06-29 DIAGNOSIS — Z6838 Body mass index (BMI) 38.0-38.9, adult: Secondary | ICD-10-CM | POA: Diagnosis not present

## 2019-07-09 DIAGNOSIS — S63612A Unspecified sprain of right middle finger, initial encounter: Secondary | ICD-10-CM | POA: Diagnosis not present

## 2019-07-09 DIAGNOSIS — M79644 Pain in right finger(s): Secondary | ICD-10-CM | POA: Diagnosis not present

## 2019-07-27 DIAGNOSIS — S63612A Unspecified sprain of right middle finger, initial encounter: Secondary | ICD-10-CM | POA: Diagnosis not present

## 2019-07-27 DIAGNOSIS — M79644 Pain in right finger(s): Secondary | ICD-10-CM | POA: Diagnosis not present

## 2019-07-27 DIAGNOSIS — Z6838 Body mass index (BMI) 38.0-38.9, adult: Secondary | ICD-10-CM | POA: Diagnosis not present

## 2019-08-03 ENCOUNTER — Telehealth: Payer: Self-pay | Admitting: Obstetrics & Gynecology

## 2019-08-03 NOTE — Telephone Encounter (Addendum)
On Nuva Ring. Pt started bleeding early Thursday. Pt is passing clots x 5 days. Pt had D&C in May. Pt's last period was in Feb. Pt is concerned that she is passing clots and that she started bleeding before she was supposed to. Please advise. Thanks!!! [employer]

## 2019-08-03 NOTE — Telephone Encounter (Signed)
Patient would like a call back and did not state the reason for the call.

## 2019-08-04 ENCOUNTER — Encounter: Payer: Self-pay | Admitting: *Deleted

## 2019-08-04 NOTE — Telephone Encounter (Signed)
Pt aware of Dr. Forestine Chute recommendations and voiced understanding. She took the [employer] out Thursday. Pt is concerned that she is still passing clots on day 6. I advised to keep it out for 7 days like Dr. Despina Hidden recommends and replace with a new one on day 8, to begin a new cycle of 3 Nuva Rings. Pt was advised if clots continue after the new Nuva Ring, let us know. Pt voiced understanding. JSY

## 2019-08-04 NOTE — Telephone Encounter (Signed)
I recommend taking the nuva ring out for7 days then replace and begin a new cycle of 3 nuva rings  For some reason her endometrium is dys synchronous so we will use this as an opportunity to resynchronize  DR Despina Hidden

## 2019-08-20 DIAGNOSIS — Z23 Encounter for immunization: Secondary | ICD-10-CM | POA: Diagnosis not present

## 2019-09-09 ENCOUNTER — Other Ambulatory Visit: Payer: Self-pay | Admitting: Obstetrics & Gynecology

## 2019-10-16 ENCOUNTER — Telehealth: Payer: Self-pay | Admitting: Obstetrics & Gynecology

## 2019-10-16 ENCOUNTER — Telehealth: Payer: Self-pay | Admitting: *Deleted

## 2019-10-16 NOTE — Telephone Encounter (Signed)
Patient states she is wanting to know if when she had her miscarriage, if lifting heavy boxes could have contributed to her miscarriage.  I informed patient that this was most likely not related and was possibly just coincidental.  Genetic testing was not performed. Pt verbalized understanding.

## 2019-10-16 NOTE — Telephone Encounter (Signed)
Patient calling to ask if we did any genetic testing with her D & C back in April Patient states she was told we could do testing to see if there was a cause - please advise

## 2020-09-23 ENCOUNTER — Other Ambulatory Visit: Payer: Self-pay

## 2020-09-23 ENCOUNTER — Ambulatory Visit
Admission: EM | Admit: 2020-09-23 | Discharge: 2020-09-23 | Disposition: A | Discharge status: Home / Self Care | Payer: Medicaid Other | Attending: Emergency Medicine | Admitting: Emergency Medicine

## 2020-09-23 DIAGNOSIS — Z20822 Contact with and (suspected) exposure to covid-19: Secondary | ICD-10-CM

## 2020-09-23 DIAGNOSIS — J019 Acute sinusitis, unspecified: Secondary | ICD-10-CM

## 2020-09-23 MED ORDER — AMOXICILLIN-POT CLAVULANATE 875-125 MG PO TABS
1.0000 | ORAL_TABLET | Freq: Two times a day (BID) | ORAL | 0 refills | Status: AC
Start: 2020-09-23 — End: 2020-10-03

## 2020-09-23 MED ORDER — BENZONATATE 100 MG PO CAPS
100.0000 mg | ORAL_CAPSULE | Freq: Three times a day (TID) | ORAL | 0 refills | Status: DC
Start: 2020-09-23 — End: 2021-06-06

## 2020-09-23 NOTE — ED Triage Notes (Signed)
Pt presents with c/o uri symptoms for past week, then began having sore throat

## 2020-09-23 NOTE — ED Provider Notes (Signed)
[facility]   [id number] 09/23/20 Arrival Time: 1501   CC: COVID symptoms  SUBJECTIVE: History from: patient.  Rebecca Gibbs is a 25 y.o. female who presents with congestion, runny nose, sore throat, fatigue x 1 week, worsened over the past day.  Daughter with similar symptoms.  Admits to covid exposure at work.  Denies alleviating or aggravating factors.  Reports previous symptoms in the past.   Denies fever, chills, SOB, wheezing, chest pain, nausea, changes in bowel or bladder habits.     ROS: As per HPI.  All other pertinent ROS negative.     Past Medical History:  Diagnosis Date   Diabetes mellitus without complication (HCC)    Gestational DM, on Glyburide   Pregnant 01/07/2014   Raynaud's disease    Past Surgical History:  Procedure Laterality Date   CHOLECYSTECTOMY N/A 05/19/2018   Procedure: LAPAROSCOPIC CHOLECYSTECTOMY;  Surgeon: [name], MD;  Location: [facility];  Service: General;  Laterality: N/A;   DILATION AND CURETTAGE OF UTERUS N/A 04/30/2019   Procedure: SUCTION DILATATION AND CURETTAGE;  Surgeon: Lazaro Arms, MD;  Location: [facility];  Service: Gynecology;  Laterality: N/A;   TONSILLECTOMY     No Known Allergies No current facility-administered medications on file prior to encounter.   Current Outpatient Medications on File Prior to Encounter  Medication Sig Dispense Refill   etonogestrel-ethinyl estradiol (NUVARING) 0.12-0.015 MG/24HR vaginal ring INSERT 1 RING VAGINALLY AS DIRECTED. REMOVE AFTER 3 WEEKS & WAIT 7 DAYS BEFORE INSERTING A NEW RING 3 each 4   Social History   Socioeconomic History   Marital status: Single    Spouse name: Not on file   Number of children: Not on file   Years of education: Not on file   Highest education level: Not on file  Occupational History   Not on file  Tobacco Use   Smoking status: Never   Smokeless tobacco: Never  Vaping Use   Vaping Use: Some days   Start date: 05/13/2014   Substances:  Flavoring  Substance and Sexual Activity   Alcohol use: No   Drug use: No   Sexual activity: Yes    Partners: Male    Birth control/protection: None  Other Topics Concern   Not on file  Social History Narrative   Not on file   Social Determinants of Health   Financial Resource Strain: Not on file  Food Insecurity: Not on file  Transportation Needs: Not on file  Physical Activity: Not on file  Stress: Not on file  Social Connections: Not on file  Intimate Partner Violence: Not on file   Family History  Problem Relation Age of Onset   Other Paternal Emelia Loron        has a pacemaker   Cancer Maternal Grandmother        cervical   Depression Maternal Grandmother    Cancer Maternal Grandfather        lung that went to brain   Diabetes Mother    Arthritis Mother    Other Mother        auto immune disease   Depression Mother     OBJECTIVE:  There were no vitals filed for this visit.   General appearance: alert; appears fatigued, but nontoxic; speaking in full sentences and tolerating own secretions HEENT: NCAT; Ears: EACs clear, TMs pearly gray; Eyes: PERRL.  EOM grossly intact. Nose: nares patent without rhinorrhea, Throat: oropharynx clear, tonsils non erythematous or enlarged, uvula midline  Neck: supple without LAD Lungs: unlabored respirations, symmetrical air entry; cough: mild; no respiratory distress; CTAB Heart: regular rate and rhythm.   Skin: warm and dry Psychological: alert and cooperative; normal mood and affect  ASSESSMENT & PLAN:  1. Exposure to COVID-19 virus   2. Acute non-recurrent sinusitis, unspecified location     Meds ordered this encounter  Medications   amoxicillin-clavulanate (AUGMENTIN) 875-125 MG tablet    Sig: Take 1 tablet by mouth every 12 (twelve) hours for 10 days.    Dispense:  20 tablet    Refill:  0    Order Specific Question:   Supervising Provider    Answer:   Eustace Moore [[id number]]   benzonatate (TESSALON) 100 MG  capsule    Sig: Take 1 capsule (100 mg total) by mouth every 8 (eight) hours.    Dispense:  21 capsule    Refill:  0    Order Specific Question:   Supervising Provider    Answer:   Eustace Moore [[id number]]    COVID testing ordered.  It will take between 5-7 days for test results.  Someone will contact you regarding abnormal results.    In the meantime: You should remain isolated in your home for 5 days from symptom onset AND greater than 72 hours after symptoms resolution (absence of fever without the use of fever-reducing medication and improvement in respiratory symptoms), whichever is longer Get plenty of rest and push fluids Augmentin for possible sinus infection Tessalon Perles prescribed for cough Use OTC zyrtec for nasal congestion, runny nose, and/or sore throat Use OTC flonase for nasal congestion and runny nose Use medications daily for symptom relief Use OTC medications like ibuprofen or tylenol as needed fever or pain Call or go to the ED if you have any new or worsening symptoms such as fever, worsening cough, shortness of breath, chest tightness, chest pain, turning blue, changes in mental status, etc...   Reviewed expectations re: course of current medical issues. Questions answered. Outlined signs and symptoms indicating need for more acute intervention. Patient verbalized understanding. After Visit Summary given.          Rennis Harding, PA-C 09/23/20 1539

## 2020-09-23 NOTE — Discharge Instructions (Signed)
COVID testing ordered.  It will take between 5-7 days for test results.  Someone will contact you regarding abnormal results.    In the meantime: You should remain isolated in your home for 5 days from symptom onset AND greater than 72 hours after symptoms resolution (absence of fever without the use of fever-reducing medication and improvement in respiratory symptoms), whichever is longer Get plenty of rest and push fluids Augmentin for possible sinus infection Tessalon Perles prescribed for cough Use OTC zyrtec for nasal congestion, runny nose, and/or sore throat Use OTC flonase for nasal congestion and runny nose Use medications daily for symptom relief Use OTC medications like ibuprofen or tylenol as needed fever or pain Call or go to the ED if you have any new or worsening symptoms such as fever, worsening cough, shortness of breath, chest tightness, chest pain, turning blue, changes in mental status, etc..Marland Kitchen

## 2020-09-24 LAB — NOVEL CORONAVIRUS, NAA: SARS-CoV-2, NAA: DETECTED — AB

## 2020-09-24 LAB — SARS-COV-2, NAA 2 DAY TAT

## 2020-11-14 ENCOUNTER — Ambulatory Visit: Payer: Federal, State, Local not specified - PPO | Admitting: Physician Assistant

## 2020-11-15 ENCOUNTER — Encounter: Payer: Self-pay | Admitting: Physician Assistant

## 2020-12-22 ENCOUNTER — Encounter: Payer: Self-pay | Admitting: Physician Assistant

## 2020-12-22 ENCOUNTER — Ambulatory Visit (INDEPENDENT_AMBULATORY_CARE_PROVIDER_SITE_OTHER): Payer: Commercial Managed Care - PPO | Admitting: Physician Assistant

## 2020-12-22 ENCOUNTER — Other Ambulatory Visit (HOSPITAL_COMMUNITY)
Admission: RE | Admit: 2020-12-22 | Discharge: 2020-12-22 | Disposition: A | Discharge status: Home / Self Care | Payer: Commercial Managed Care - PPO | Source: Ambulatory Visit | Attending: Physician Assistant | Admitting: Physician Assistant

## 2020-12-22 VITALS — BP 134/80 | HR 78 | Temp 97.0°F | Ht 65.0 in | Wt 239.2 lb

## 2020-12-22 DIAGNOSIS — Z131 Encounter for screening for diabetes mellitus: Secondary | ICD-10-CM

## 2020-12-22 DIAGNOSIS — Z13220 Encounter for screening for lipoid disorders: Secondary | ICD-10-CM

## 2020-12-22 DIAGNOSIS — R5383 Other fatigue: Secondary | ICD-10-CM | POA: Diagnosis not present

## 2020-12-22 DIAGNOSIS — Z113 Encounter for screening for infections with a predominantly sexual mode of transmission: Secondary | ICD-10-CM | POA: Diagnosis present

## 2020-12-22 DIAGNOSIS — M129 Arthropathy, unspecified: Secondary | ICD-10-CM | POA: Diagnosis not present

## 2020-12-22 LAB — COMPREHENSIVE METABOLIC PANEL
ALT: 19 U/L (ref 0–35)
AST: 14 U/L (ref 0–37)
Albumin: 4 g/dL (ref 3.5–5.2)
Alkaline Phosphatase: 48 U/L (ref 39–117)
BUN: 9 mg/dL (ref 6–23)
CO2: 25 mEq/L (ref 19–32)
Calcium: 8.8 mg/dL (ref 8.4–10.5)
Chloride: 106 mEq/L (ref 96–112)
Creatinine, Ser: 0.72 mg/dL (ref 0.40–1.20)
GFR: 117.18 mL/min (ref >60.00)
Glucose, Bld: 91 mg/dL (ref 70–99)
Potassium: 4.3 mEq/L (ref 3.5–5.1)
Sodium: 139 mEq/L (ref 135–145)
Total Bilirubin: 0.5 mg/dL (ref 0.2–1.2)
Total Protein: 6.8 g/dL (ref 6.0–8.3)

## 2020-12-22 LAB — CBC WITH DIFFERENTIAL/PLATELET
Basophils Absolute: 0 10*3/uL (ref 0.0–0.1)
Basophils Relative: 0.4 % (ref 0.0–3.0)
Eosinophils Absolute: 0.2 10*3/uL (ref 0.0–0.7)
Eosinophils Relative: 2.9 % (ref 0.0–5.0)
HCT: 34.4 % — ABNORMAL LOW (ref 36.0–46.0)
Hemoglobin: 11.8 g/dL — ABNORMAL LOW (ref 12.0–15.0)
Lymphocytes Relative: 36.1 % (ref 12.0–46.0)
Lymphs Abs: 2.9 10*3/uL (ref 0.7–4.0)
MCHC: 34.3 g/dL (ref 30.0–36.0)
MCV: 82.2 fl (ref 78.0–100.0)
Monocytes Absolute: 0.5 10*3/uL (ref 0.1–1.0)
Monocytes Relative: 6.1 % (ref 3.0–12.0)
Neutro Abs: 4.4 10*3/uL (ref 1.4–7.7)
Neutrophils Relative %: 54.5 % (ref 43.0–77.0)
Platelets: 284 10*3/uL (ref 150.0–400.0)
RBC: 4.19 Mil/uL (ref 3.87–5.11)
RDW: 15.3 % (ref 11.5–15.5)
WBC: 8.1 10*3/uL (ref 4.0–10.5)

## 2020-12-22 LAB — LIPID PANEL
Cholesterol: 157 mg/dL (ref 0–200)
HDL: 56.8 mg/dL (ref >39.00)
LDL Cholesterol: 78 mg/dL (ref 0–99)
NonHDL: 100.24
Total CHOL/HDL Ratio: 3
Triglycerides: 110 mg/dL (ref 0.0–149.0)
VLDL: 22 mg/dL (ref 0.0–40.0)

## 2020-12-22 LAB — TSH: TSH: 2.86 u[IU]/mL (ref 0.35–5.50)

## 2020-12-22 LAB — HEMOGLOBIN A1C: Hgb A1c MFr Bld: 5 % (ref 4.6–6.5)

## 2020-12-22 LAB — VITAMIN D 25 HYDROXY (VIT D DEFICIENCY, FRACTURES): VITD: 17.34 ng/mL — ABNORMAL LOW (ref 30.00–100.00)

## 2020-12-22 LAB — SEDIMENTATION RATE: Sed Rate: 13 mm/hr (ref 0–20)

## 2020-12-22 LAB — VITAMIN B12: Vitamin B-12: 205 pg/mL — ABNORMAL LOW (ref 211–911)

## 2020-12-22 NOTE — Progress Notes (Signed)
Subjective:    Patient ID: Rebecca Gibbs, female    DOB: 1995-10-03, 25 y.o.   MRN: [id number]  Chief Complaint  Patient presents with   Establish Care    HPI 25 y.o. patient presents today for new patient establishment with me.  Patient was previously established with me at [facility].  Current Care Team: GYN for female care   Acute Concerns: -Ongoing fatigue and joint pain / stiffness, would like autoimmune labs drawn today -Recent separation from husband this past year and also new relationship. No symptoms, but she would like STD testing done.    Past Medical History:  Diagnosis Date   Diabetes mellitus without complication (HCC)    Gestational DM, on Glyburide   Pregnant 01/07/2014   Raynaud's disease     Past Surgical History:  Procedure Laterality Date   CHOLECYSTECTOMY N/A 05/19/2018   Procedure: LAPAROSCOPIC CHOLECYSTECTOMY;  Surgeon: [name], MD;  Location: [facility];  Service: General;  Laterality: N/A;   DILATION AND CURETTAGE OF UTERUS N/A 04/30/2019   Procedure: SUCTION DILATATION AND CURETTAGE;  Surgeon: Lazaro Arms, MD;  Location: [facility];  Service: Gynecology;  Laterality: N/A;   TONSILLECTOMY      Family History  Problem Relation Age of Onset   Diabetes Mother    Arthritis Mother    Other Mother        auto immune disease   Depression Mother    Kidney disease Mother    Cancer Maternal Grandmother        cervical   Depression Maternal Grandmother    Cancer Maternal Grandfather        lung that went to brain   Other Paternal Grandfather        has a pacemaker    Social History   Tobacco Use   Smoking status: Never   Smokeless tobacco: Never  Vaping Use   Vaping Use: Some days   Start date: 05/13/2014   Substances: Flavoring  Substance Use Topics   Alcohol use: No   Drug use: No     No Known Allergies  Review of Systems NEGATIVE UNLESS OTHERWISE INDICATED IN HPI      Objective:     BP 134/80    Pulse  78    Temp (!) 97 F (36.1 C)    Ht 5\' 5"  (1.651 m)    Wt 239 lb 3.2 oz (108.5 kg)    SpO2 98%    BMI 39.80 kg/m   Wt Readings from Last 3 Encounters:  12/22/20 239 lb 3.2 oz (108.5 kg)  05/18/19 237 lb (107.5 kg)  04/30/19 231 lb (104.8 kg)    BP Readings from Last 3 Encounters:  12/22/20 134/80  09/23/20 124/85  05/18/19 129/75     Physical Exam Vitals and nursing note reviewed.  Constitutional:      Appearance: Normal appearance. She is obese. She is not toxic-appearing.  HENT:     Head: Normocephalic and atraumatic.     Right Ear: External ear normal.     Left Ear: External ear normal.     Nose: Nose normal.     Mouth/Throat:     Mouth: Mucous membranes are moist.  Eyes:     Extraocular Movements: Extraocular movements intact.     Conjunctiva/sclera: Conjunctivae normal.     Pupils: Pupils are equal, round, and reactive to light.  Cardiovascular:     Rate and Rhythm: Normal rate and regular rhythm.  Pulses: Normal pulses.     Heart sounds: Normal heart sounds.  Pulmonary:     Effort: Pulmonary effort is normal.     Breath sounds: Normal breath sounds.  Musculoskeletal:        General: Normal range of motion.     Cervical back: Normal range of motion and neck supple.  Skin:    General: Skin is warm and dry.  Neurological:     General: No focal deficit present.     Mental Status: She is alert and oriented to person, place, and time.  Psychiatric:        Mood and Affect: Mood normal.        Behavior: Behavior normal.        Thought Content: Thought content normal.        Judgment: Judgment normal.     Comments: Tearful discussing mom's passing       Assessment & Plan:   Problem List Items Addressed This Visit   None Visit Diagnoses     Screening for STD (sexually transmitted disease)    -  Primary   Relevant Orders   Urine cytology ancillary only   RPR   HIV Antibody (routine testing w rflx)   Other fatigue       Relevant Orders   Iron, TIBC and  Ferritin Panel   VITAMIN D 25 Hydroxy (Vit-D Deficiency, Fractures)   Vitamin B12   TSH   Comprehensive metabolic panel   CBC with Differential/Platelet   Sedimentation rate   ANA   Rheumatoid factor   Diabetes mellitus screening       Relevant Orders   Comprehensive metabolic panel   Hemoglobin A1c   Screening for cholesterol level       Relevant Orders   Lipid panel   Arthritis, multiple joint involvement       Relevant Orders   VITAMIN D 25 Hydroxy (Vit-D Deficiency, Fractures)   Vitamin B12   TSH   Sedimentation rate   ANA   Rheumatoid factor       Plan -Check labs as requested and treat pending results -Encouraged counseling for grief / recent life changes -Encouraged more physical exercise and healthier eating habits -Plan to f/up this summer for CPE and recheck labs  -Pt to call in-between as needed   This note was prepared with assistance of Dragon voice recognition software. Occasional wrong-word or sound-a-like substitutions may have occurred due to the inherent limitations of voice recognition software.  Time Spent: 23 minutes of total time was spent on the date of the encounter performing the following actions: chart review prior to seeing the patient, obtaining history, performing a medically necessary exam, counseling on the treatment plan, placing orders, and documenting in our EHR.    Izac Faulkenberry M Zyier Dykema, PA-C

## 2020-12-22 NOTE — Patient Instructions (Signed)
Good to see you today! Please go to the lab for blood work and I will send results through MyChart. Let me know how else I can help. Consider counseling referral.

## 2020-12-24 LAB — IRON,TIBC AND FERRITIN PANEL
%SAT: 12 % (calc) — ABNORMAL LOW (ref 16–45)
Ferritin: 14 ng/mL — ABNORMAL LOW (ref 16–154)
Iron: 49 ug/dL (ref 40–190)
TIBC: 422 mcg/dL (calc) (ref 250–450)

## 2020-12-24 LAB — RPR: RPR Ser Ql: NONREACTIVE

## 2020-12-24 LAB — RHEUMATOID FACTOR: Rheumatoid fact SerPl-aCnc: <14 IU/mL (ref <14)

## 2020-12-24 LAB — ANA: Anti Nuclear Antibody (ANA): NEGATIVE

## 2020-12-24 LAB — HIV ANTIBODY (ROUTINE TESTING W REFLEX): HIV 1&2 Ab, 4th Generation: NONREACTIVE

## 2020-12-27 LAB — URINE CYTOLOGY ANCILLARY ONLY
Chlamydia: NEGATIVE
Comment: NEGATIVE
Comment: NEGATIVE
Comment: NORMAL
Neisseria Gonorrhea: NEGATIVE
Trichomonas: NEGATIVE

## 2021-01-03 ENCOUNTER — Other Ambulatory Visit: Payer: Self-pay | Admitting: Physician Assistant

## 2021-01-03 DIAGNOSIS — Z6841 Body Mass Index (BMI) 40.0 and over, adult: Secondary | ICD-10-CM | POA: Diagnosis not present

## 2021-01-03 DIAGNOSIS — Z20828 Contact with and (suspected) exposure to other viral communicable diseases: Secondary | ICD-10-CM | POA: Diagnosis not present

## 2021-01-03 DIAGNOSIS — J069 Acute upper respiratory infection, unspecified: Secondary | ICD-10-CM | POA: Diagnosis not present

## 2021-01-03 MED ORDER — VITAMIN D (ERGOCALCIFEROL) 1.25 MG (50000 UNIT) PO CAPS: 50000.0000 [IU] | ORAL_CAPSULE | ORAL | 0 refills | Status: DC

## 2021-01-03 MED ORDER — IRON (FERROUS SULFATE) 325 (65 FE) MG PO TABS: 325.0000 mg | ORAL_TABLET | Freq: Every day | ORAL | 1 refills | Status: DC

## 2021-01-09 ENCOUNTER — Encounter: Payer: Self-pay | Admitting: Physician Assistant

## 2021-03-04 ENCOUNTER — Ambulatory Visit
Admission: EM | Admit: 2021-03-04 | Discharge: 2021-03-04 | Disposition: A | Discharge status: Home / Self Care | Payer: Commercial Managed Care - PPO | Attending: Family Medicine | Admitting: Family Medicine

## 2021-03-04 ENCOUNTER — Other Ambulatory Visit: Payer: Self-pay

## 2021-03-04 DIAGNOSIS — J020 Streptococcal pharyngitis: Secondary | ICD-10-CM

## 2021-03-04 LAB — POCT RAPID STREP A (OFFICE): Rapid Strep A Screen: POSITIVE — AB

## 2021-03-04 MED ORDER — AMOXICILLIN-POT CLAVULANATE 875-125 MG PO TABS: 1.0000 | ORAL_TABLET | Freq: Two times a day (BID) | ORAL | 0 refills | Status: DC

## 2021-03-04 NOTE — Discharge Instructions (Signed)
Your strep test was positive.  We are treating you for strep throat.  You can take Tylenol and ibuprofen for pain as needed.  Warm salt water gargles. ?

## 2021-03-04 NOTE — ED Provider Notes (Signed)
?[facility] ? ? ? ?CSN: [id number] ?Arrival date & time: 03/04/21  1253 ? ? ?  ? ?History   ?Chief Complaint ?Chief Complaint  ?Patient presents with  ? Sore Throat  ? ? ?HPI ?Rebecca Gibbs is a 26 y.o. female.  ? ?26 year old female with sore throat that started yesterday.  Pain and difficulty with swallowing.  No fevers.  Has not taken anything for her symptoms. ? ? ?Sore Throat ? ? ?Past Medical History:  ?Diagnosis Date  ? Diabetes mellitus without complication (HCC)   ? Gestational DM, on Glyburide  ? Pregnant 01/07/2014  ? Raynaud's disease   ? ? ?Patient Active Problem List  ? Diagnosis Date Noted  ? Calculus of gallbladder without cholecystitis without obstruction 05/08/2018  ? Post partum depression 07/28/2015  ? Term pregnancy 08/31/2014  ? Gestational diabetes mellitus, class A2 07/15/2014  ? Pregnant 01/07/2014  ? ? ?Past Surgical History:  ?Procedure Laterality Date  ? CHOLECYSTECTOMY N/A 05/19/2018  ? Procedure: LAPAROSCOPIC CHOLECYSTECTOMY;  Surgeon: [name], MD;  Location: [facility];  Service: General;  Laterality: N/A;  ? DILATION AND CURETTAGE OF UTERUS N/A 04/30/2019  ? Procedure: SUCTION DILATATION AND CURETTAGE;  Surgeon: Lazaro Arms, MD;  Location: [facility];  Service: Gynecology;  Laterality: N/A;  ? TONSILLECTOMY    ? ? ?OB History   ? ? Gravida  ?3  ? Para  ?1  ? Term  ?1  ? Preterm  ?   ? AB  ?2  ? Living  ?1  ?  ? ? SAB  ?1  ? IAB  ?   ? Ectopic  ?   ? Multiple  ?0  ? Live Births  ?1  ?   ?  ?  ? ? ? ?Home Medications   ? ?Prior to Admission medications   ?Medication Sig Start Date End Date Taking? Authorizing Provider  ?amoxicillin-clavulanate (AUGMENTIN) 875-125 MG tablet Take 1 tablet by mouth every 12 (twelve) hours. 03/04/21  Yes Kahliyah Dick A, NP  ?benzonatate (TESSALON) 100 MG capsule Take 1 capsule (100 mg total) by mouth every 8 (eight) hours. 09/23/20   Wurst, [place], PA-C  ?etonogestrel-ethinyl estradiol (NUVARING) 0.12-0.015 MG/24HR vaginal ring INSERT 1 RING  VAGINALLY AS DIRECTED. REMOVE AFTER 3 WEEKS & WAIT 7 DAYS BEFORE INSERTING A NEW RING 09/10/19   Lazaro Arms, MD  ?FLUoxetine (PROZAC) 20 MG capsule Take 20 mg by mouth daily. 08/19/20   [provider]  ?Iron, Ferrous Sulfate, 325 (65 Fe) MG TABS Take 325 mg by mouth daily. Take with Vitamin C. 01/03/21 04/03/21  Allwardt, Crist Infante, PA-C  ?Vitamin D, Ergocalciferol, (DRISDOL) 1.25 MG (50000 UNIT) CAPS capsule Take 1 capsule (50,000 Units total) by mouth every 7 (seven) days. 01/03/21   Allwardt, Crist Infante, PA-C  ? ? ?Family History ?Family History  ?Problem Relation Age of Onset  ? Diabetes Mother   ? Arthritis Mother   ? Other Mother   ?     auto immune disease  ? Depression Mother   ? Kidney disease Mother   ? Cancer Maternal Grandmother   ?     cervical  ? Depression Maternal Grandmother   ? Cancer Maternal Grandfather   ?     lung that went to brain  ? Other Paternal Grandfather   ?     has a pacemaker  ? ? ?Social History ?Social History  ? ?Tobacco Use  ? Smoking status: Never  ? Smokeless  tobacco: Never  ?Vaping Use  ? Vaping Use: Some days  ? Start date: 05/13/2014  ? Substances: Flavoring  ?Substance Use Topics  ? Alcohol use: No  ? Drug use: No  ? ? ? ?Allergies   ?Patient has no known allergies. ? ? ?Review of Systems ?Review of Systems ? ?Per HPI  ?Physical Exam ?Triage Vital Signs ?ED Triage Vitals [03/04/21 1259]  ?Enc Vitals Group  ?   BP 116/70  ?   Pulse Rate (!) 101  ?   Resp 18  ?   Temp 98.7 ?F (37.1 ?C)  ?   Temp Source Oral  ?   SpO2 95 %  ?   Weight   ?   Height   ?   Head Circumference   ?   Peak Flow   ?   Pain Score 9  ?   Pain Loc   ?   Pain Edu?   ?   Excl. in GC?   ? ?No data found. ? ?Updated Vital Signs ?BP 116/70 (BP Location: Right Arm)   Pulse (!) 101   Temp 98.7 ?F (37.1 ?C) (Oral)   Resp 18   LMP 02/28/2021 (Exact Date)   SpO2 95%   Breastfeeding No  ? ?Visual Acuity ?Right Eye Distance:   ?Left Eye Distance:   ?Bilateral Distance:   ? ?Right Eye Near:   ?Left Eye Near:     ?Bilateral Near:    ? ?Physical Exam ?Constitutional:   ?   General: She is not in acute distress. ?   Appearance: She is well-developed. She is ill-appearing. She is not toxic-appearing or diaphoretic.  ?HENT:  ?   Head: Normocephalic and atraumatic.  ?   Mouth/Throat:  ?   Pharynx: Posterior oropharyngeal erythema present.  ?   Tonsils: No tonsillar exudate. 2+ on the right. 2+ on the left.  ?Pulmonary:  ?   Effort: Pulmonary effort is normal.  ?Neurological:  ?   Mental Status: She is alert.  ? ? ? ?UC Treatments / Results  ?Labs ?(all labs ordered are listed, but only abnormal results are displayed) ?Labs Reviewed  ?POCT RAPID STREP A (OFFICE) - Abnormal; Notable for the following components:  ?    Result Value  ? Rapid Strep A Screen Positive (*)   ? All other components within normal limits  ? ? ?EKG ? ? ?Radiology ?No results found. ? ?Procedures ?Procedures (including critical care time) ? ?Medications Ordered in UC ?Medications - No data to display ? ?Initial Impression / Assessment and Plan / UC Course  ?I have reviewed the triage vital signs and the nursing notes. ? ?Pertinent labs & imaging results that were available during my care of the patient were reviewed by me and considered in my medical decision making (see chart for details). ? ?  ? ?Rapid strep test positive today.  Treating for strep throat.  Antibiotics as prescribed.  Tylenol and ibuprofen for pain as needed.  Warm salt water gargles. ?Final Clinical Impressions(s) / UC Diagnoses  ? ?Final diagnoses:  ?Strep throat  ? ? ? ?Discharge Instructions   ? ?  ?Your strep test was positive.  We are treating you for strep throat.  You can take Tylenol and ibuprofen for pain as needed.  Warm salt water gargles. ? ? ? ?ED Prescriptions   ? ? Medication Sig Dispense Auth. Provider  ? amoxicillin-clavulanate (AUGMENTIN) 875-125 MG tablet Take 1 tablet by mouth every 12 (twelve) hours.  14 tablet Dahlia Byes A, NP  ? ?  ? ?PDMP not reviewed this  encounter. ?  ?Janace Aris, NP ?03/04/21 1353 ? ?

## 2021-03-04 NOTE — ED Triage Notes (Signed)
Pt reports sore throat and difficulty swallowing since last night.  ?

## 2021-05-20 ENCOUNTER — Other Ambulatory Visit: Payer: Self-pay | Admitting: Obstetrics & Gynecology

## 2021-06-06 ENCOUNTER — Ambulatory Visit
Admission: EM | Admit: 2021-06-06 | Discharge: 2021-06-06 | Disposition: A | Discharge status: Home / Self Care | Payer: Commercial Managed Care - PPO | Attending: Nurse Practitioner | Admitting: Nurse Practitioner

## 2021-06-06 DIAGNOSIS — J069 Acute upper respiratory infection, unspecified: Secondary | ICD-10-CM

## 2021-06-06 MED ORDER — BENZONATATE 100 MG PO CAPS: 100.0000 mg | ORAL_CAPSULE | Freq: Three times a day (TID) | ORAL | 0 refills | Status: DC | PRN

## 2021-06-06 MED ORDER — PROMETHAZINE-DM 6.25-15 MG/5ML PO SYRP: 5.0000 mL | ORAL_SOLUTION | Freq: Four times a day (QID) | ORAL | 0 refills | Status: DC | PRN

## 2021-06-06 NOTE — ED Provider Notes (Signed)
[facility]    CSN: [id number] Arrival date & time: 06/06/21  1303      History   Chief Complaint Chief Complaint  Patient presents with   Recurrent Sinusitis    HPI Rebecca Gibbs is a 26 y.o. female.   Patient presents for a "sinus infection" that started Sunday night.  She endorses congested cough, nasal congestion, runny nose, postnasal drainage, sore throat, swollen glands, sinus pressure, headache, tooth pain, ear pressure, and fatigue.  She denies shortness of breath, chest pain, wheezing, fever, sneezing, abdominal pain, nausea/vomiting, diarrhea, decreased appetite.  She reports her children are also sick currently.  Denies any family members with strep throat.  She has been taking over-the-counter medications that have been minimally helpful.    Past Medical History:  Diagnosis Date   Diabetes mellitus without complication (HCC)    Gestational DM, on Glyburide   Pregnant 01/07/2014   Raynaud's disease     Patient Active Problem List   Diagnosis Date Noted   Calculus of gallbladder without cholecystitis without obstruction 05/08/2018   Post partum depression 07/28/2015   Term pregnancy 08/31/2014   Gestational diabetes mellitus, class A2 07/15/2014   Pregnant 01/07/2014    Past Surgical History:  Procedure Laterality Date   CHOLECYSTECTOMY N/A 05/19/2018   Procedure: LAPAROSCOPIC CHOLECYSTECTOMY;  Surgeon: [name], MD;  Location: [facility];  Service: General;  Laterality: N/A;   DILATION AND CURETTAGE OF UTERUS N/A 04/30/2019   Procedure: SUCTION DILATATION AND CURETTAGE;  Surgeon: Lazaro Arms, MD;  Location: [facility];  Service: Gynecology;  Laterality: N/A;   TONSILLECTOMY      OB History     Gravida  3   Para  1   Term  1   Preterm      AB  2   Living  1      SAB  1   IAB      Ectopic      Multiple  0   Live Births  1            Home Medications    Prior to Admission medications   Medication Sig Start Date  End Date Taking? Authorizing Provider  promethazine-dextromethorphan (PROMETHAZINE-DM) 6.25-15 MG/5ML syrup Take 5 mLs by mouth 4 (four) times daily as needed for cough. Do not take while driving or operating heavy machinery 06/06/21  Yes Cathlean Marseilles A, NP  benzonatate (TESSALON) 100 MG capsule Take 1 capsule (100 mg total) by mouth 3 (three) times daily as needed for cough. Do not take while driving or operating heavy machinery 06/06/21   Valentino Nose, NP  etonogestrel-ethinyl estradiol (NUVARING) 0.12-0.015 MG/24HR vaginal ring INSERT 1 RING VAGINALLY AS DIRECTED. REMOVE AFTER 3 WEEKS & WAIT 7 DAYS BEFORE INSERTING A NEW RING 05/30/21   Lazaro Arms, MD  FLUoxetine (PROZAC) 20 MG capsule Take 20 mg by mouth daily. 08/19/20   [provider]  Iron, Ferrous Sulfate, 325 (65 Fe) MG TABS Take 325 mg by mouth daily. Take with Vitamin C. 01/03/21 04/03/21  Allwardt, Alyssa M, PA-C  Vitamin D, Ergocalciferol, (DRISDOL) 1.25 MG (50000 UNIT) CAPS capsule Take 1 capsule (50,000 Units total) by mouth every 7 (seven) days. 01/03/21   Allwardt, Crist Infante, PA-C    Family History Family History  Problem Relation Age of Onset   Diabetes Mother    Arthritis Mother    Other Mother        auto immune disease  Depression Mother    Kidney disease Mother    Cancer Maternal Grandmother        cervical   Depression Maternal Grandmother    Cancer Maternal Grandfather        lung that went to brain   Other Paternal Grandfather        has a pacemaker    Social History Social History   Tobacco Use   Smoking status: Never   Smokeless tobacco: Never  Vaping Use   Vaping Use: Some days   Start date: 05/13/2014   Substances: Flavoring  Substance Use Topics   Alcohol use: Yes    Comment: Occas   Drug use: No     Allergies   Patient has no known allergies.   Review of Systems Review of Systems Per HPI  Physical Exam Triage Vital Signs ED Triage Vitals  Enc Vitals Group     BP  06/06/21 1347 125/87     Pulse Rate 06/06/21 1347 (!) 104     Resp 06/06/21 1347 20     Temp 06/06/21 1347 98.3 F (36.8 C)     Temp Source 06/06/21 1347 Oral     SpO2 06/06/21 1347 98 %     Weight --      Height --      Head Circumference --      Peak Flow --      Pain Score 06/06/21 1349 9     Pain Loc --      Pain Edu? --      Excl. in GC? --    No data found.  Updated Vital Signs BP 125/87 (BP Location: Right Arm)   Pulse (!) 104   Temp 98.3 F (36.8 C) (Oral)   Resp 20   LMP 05/15/2021 (Exact Date)   SpO2 98%   Visual Acuity Right Eye Distance:   Left Eye Distance:   Bilateral Distance:    Right Eye Near:   Left Eye Near:    Bilateral Near:     Physical Exam Vitals and nursing note reviewed.  Constitutional:      General: She is not in acute distress.    Appearance: Normal appearance. She is not ill-appearing or toxic-appearing.  HENT:     Head: Normocephalic and atraumatic.     Right Ear: Tympanic membrane, ear canal and external ear normal.     Left Ear: Tympanic membrane, ear canal and external ear normal.     Nose: Congestion and rhinorrhea present.     Mouth/Throat:     Mouth: Mucous membranes are moist.     Pharynx: Oropharynx is clear. Posterior oropharyngeal erythema present. No oropharyngeal exudate.  Eyes:     General: No scleral icterus.    Extraocular Movements: Extraocular movements intact.  Cardiovascular:     Rate and Rhythm: Normal rate and regular rhythm.  Pulmonary:     Effort: Pulmonary effort is normal. No respiratory distress.     Breath sounds: Normal breath sounds. No wheezing, rhonchi or rales.  Abdominal:     General: Abdomen is flat. Bowel sounds are normal. There is no distension.     Palpations: Abdomen is soft.  Musculoskeletal:     Cervical back: Normal range of motion and neck supple.  Lymphadenopathy:     Cervical: No cervical adenopathy.  Skin:    General: Skin is warm and dry.     Coloration: Skin is not  jaundiced or pale.     Findings: No  erythema or rash.  Neurological:     Mental Status: She is alert and oriented to person, place, and time.  Psychiatric:        Behavior: Behavior is cooperative.     UC Treatments / Results  Labs (all labs ordered are listed, but only abnormal results are displayed) Labs Reviewed  NOVEL CORONAVIRUS, NAA    EKG   Radiology No results found.  Procedures Procedures (including critical care time)  Medications Ordered in UC Medications - No data to display  Initial Impression / Assessment and Plan / UC Course  I have reviewed the triage vital signs and the nursing notes.  Pertinent labs & imaging results that were available during my care of the patient were reviewed by me and considered in my medical decision making (see chart for details).    Reassured patient that symptoms and exam findings are most consistent with a viral upper respiratory infection and explained lack of efficacy of antibiotics against viruses.  COVID-19 testing obtained.  Discussed expected course and features suggestive of secondary bacterial infection.  Continue supportive care. Increase fluid intake with water or electrolyte solution like pedialyte. Encouraged acetaminophen as needed for fever/pain. Encouraged salt water gargling, chloraseptic spray and throat lozenges. Encouraged OTC guaifenesin. Encouraged saline sinus flushes and/or neti with humidified air.  Start alternating cough suppressant including Tessalon Perles and promethazine-dextromethorphan as needed for cough.  Note given for work.  Seek care if symptoms persist or worsen despite treatment.  Final Clinical Impressions(s) / UC Diagnoses   Final diagnoses:  Viral URI with cough     Discharge Instructions      Your symptoms and exam findings are most consistent with a viral upper respiratory infection. These usually run their course in about 10 days.  If your symptoms last longer than 10 days without  improvement, please follow up with your primary care provider.  If your symptoms, worsen, please go to the Emergency Room.    We have tested you today for COVID-19.  You will see the results in Mychart and we will call you with positive results.    Please stay home and isolate until you are aware of the results.    Some things that can make you feel better are: - Increased rest - Increasing fluid with water/sugar free electrolytes - Acetaminophen and ibuprofen as needed for fever/pain.  - Salt water gargling, chloraseptic spray and throat lozenges - OTC guaifenesin (Mucinex).  - Saline sinus flushes or a neti pot.  - Humidifying the air. -Cough syrup or cough perles as needed; do not take at the same time and avoid while driving or operating heavy machinery    ED Prescriptions     Medication Sig Dispense Auth. Provider   benzonatate (TESSALON) 100 MG capsule Take 1 capsule (100 mg total) by mouth 3 (three) times daily as needed for cough. Do not take while driving or operating heavy machinery 21 capsule Cathlean Marseilles A, NP   promethazine-dextromethorphan (PROMETHAZINE-DM) 6.25-15 MG/5ML syrup Take 5 mLs by mouth 4 (four) times daily as needed for cough. Do not take while driving or operating heavy machinery 118 mL Valentino Nose, NP      PDMP not reviewed this encounter.   Valentino Nose, NP 06/06/21 [phone]

## 2021-06-06 NOTE — ED Triage Notes (Signed)
Pt states she thinks she has a sinus infection since Sunday night  Pt states she is experiencing hoarse voice, tooth ache, congestion, cough and fever

## 2021-06-06 NOTE — Discharge Instructions (Signed)
Your symptoms and exam findings are most consistent with a viral upper respiratory infection. These usually run their course in about 10 days.  If your symptoms last longer than 10 days without improvement, please follow up with your primary care provider.  If your symptoms, worsen, please go to the Emergency Room.    We have tested you today for COVID-19.  You will see the results in Mychart and we will call you with positive results.    Please stay home and isolate until you are aware of the results.    Some things that can make you feel better are: - Increased rest - Increasing fluid with water/sugar free electrolytes - Acetaminophen and ibuprofen as needed for fever/pain.  - Salt water gargling, chloraseptic spray and throat lozenges - OTC guaifenesin (Mucinex).  - Saline sinus flushes or a neti pot.  - Humidifying the air. -Cough syrup or cough perles as needed; do not take at the same time and avoid while driving or operating heavy machinery

## 2021-06-07 LAB — NOVEL CORONAVIRUS, NAA: SARS-CoV-2, NAA: NOT DETECTED

## 2021-09-12 ENCOUNTER — Ambulatory Visit: Payer: Commercial Managed Care - PPO | Admitting: Physician Assistant

## 2021-09-14 ENCOUNTER — Ambulatory Visit: Payer: Commercial Managed Care - PPO | Admitting: Physician Assistant

## 2021-09-18 ENCOUNTER — Ambulatory Visit
Admission: EM | Admit: 2021-09-18 | Discharge: 2021-09-18 | Disposition: A | Discharge status: Home / Self Care | Payer: Commercial Managed Care - PPO | Attending: Family Medicine | Admitting: Family Medicine

## 2021-09-18 ENCOUNTER — Encounter: Payer: Self-pay | Admitting: *Deleted

## 2021-09-18 DIAGNOSIS — J069 Acute upper respiratory infection, unspecified: Secondary | ICD-10-CM | POA: Insufficient documentation

## 2021-09-18 DIAGNOSIS — F32A Depression, unspecified: Secondary | ICD-10-CM | POA: Diagnosis present

## 2021-09-18 DIAGNOSIS — R059 Cough, unspecified: Secondary | ICD-10-CM | POA: Insufficient documentation

## 2021-09-18 DIAGNOSIS — F419 Anxiety disorder, unspecified: Secondary | ICD-10-CM | POA: Insufficient documentation

## 2021-09-18 DIAGNOSIS — Z20822 Contact with and (suspected) exposure to covid-19: Secondary | ICD-10-CM | POA: Diagnosis not present

## 2021-09-18 LAB — RESP PANEL BY RT-PCR (FLU A&B, COVID) ARPGX2
Influenza A by PCR: NEGATIVE
Influenza B by PCR: NEGATIVE
SARS Coronavirus 2 by RT PCR: NEGATIVE

## 2021-09-18 MED ORDER — FLUOXETINE HCL 20 MG PO CAPS: 20.0000 mg | ORAL_CAPSULE | Freq: Every day | ORAL | 2 refills | Status: DC

## 2021-09-18 MED ORDER — PROMETHAZINE-DM 6.25-15 MG/5ML PO SYRP: 5.0000 mL | ORAL_SOLUTION | Freq: Four times a day (QID) | ORAL | 0 refills | Status: DC | PRN

## 2021-09-18 NOTE — ED Provider Notes (Signed)
[facility]    CSN: [id number] Arrival date & time: 09/18/21  1628      History   Chief Complaint Chief Complaint  Patient presents with   Cough   Nasal Congestion   HPI ISSABELLA RIX is a 26 y.o. female.   Patient presenting today with 3-day history of cough, congestion, body aches, chills, sweats.  Now having some nausea and decreased appetite.  Denies chest pain, shortness of breath, abdominal pain.  Taking Flonase, Sudafed with minimal relief as initially she thought this was seasonal allergies.  Multiple sick contacts recently.  Known history of chronic pulmonary disease.  She is also requesting a refill of Prozac which she takes for anxiety and depression.  She states she has no side effects or concerns with the medication.   Past Medical History:  Diagnosis Date   Diabetes mellitus without complication (HCC)    Gestational DM, on Glyburide   Pregnant 01/07/2014   Raynaud's disease     Patient Active Problem List   Diagnosis Date Noted   Calculus of gallbladder without cholecystitis without obstruction 05/08/2018   Post partum depression 07/28/2015   Term pregnancy 08/31/2014   Gestational diabetes mellitus, class A2 07/15/2014   Pregnant 01/07/2014    Past Surgical History:  Procedure Laterality Date   CHOLECYSTECTOMY N/A 05/19/2018   Procedure: LAPAROSCOPIC CHOLECYSTECTOMY;  Surgeon: [name], MD;  Location: [facility];  Service: General;  Laterality: N/A;   DILATION AND CURETTAGE OF UTERUS N/A 04/30/2019   Procedure: SUCTION DILATATION AND CURETTAGE;  Surgeon: Lazaro Arms, MD;  Location: [facility];  Service: Gynecology;  Laterality: N/A;   TONSILLECTOMY     OB History     Gravida  3   Para  1   Term  1   Preterm      AB  2   Living  1      SAB  1   IAB      Ectopic      Multiple  0   Live Births  1            Home Medications    Prior to Admission medications   Medication Sig Start Date End Date Taking?  Authorizing Provider  etonogestrel-ethinyl estradiol (NUVARING) 0.12-0.015 MG/24HR vaginal ring INSERT 1 RING VAGINALLY AS DIRECTED. REMOVE AFTER 3 WEEKS & WAIT 7 DAYS BEFORE INSERTING A NEW RING 05/30/21  Yes Lazaro Arms, MD  promethazine-dextromethorphan (PROMETHAZINE-DM) 6.25-15 MG/5ML syrup Take 5 mLs by mouth 4 (four) times daily as needed. 09/18/21  Yes Particia Nearing, PA-C  benzonatate (TESSALON) 100 MG capsule Take 1 capsule (100 mg total) by mouth 3 (three) times daily as needed for cough. Do not take while driving or operating heavy machinery 06/06/21   Valentino Nose, NP  FLUoxetine (PROZAC) 20 MG capsule Take 1 capsule (20 mg total) by mouth daily. 09/18/21   Particia Nearing, PA-C  Iron, Ferrous Sulfate, 325 (65 Fe) MG TABS Take 325 mg by mouth daily. Take with Vitamin C. 01/03/21 04/03/21  Allwardt, Alyssa M, PA-C  promethazine-dextromethorphan (PROMETHAZINE-DM) 6.25-15 MG/5ML syrup Take 5 mLs by mouth 4 (four) times daily as needed for cough. Do not take while driving or operating heavy machinery 06/06/21   Valentino Nose, NP  Vitamin D, Ergocalciferol, (DRISDOL) 1.25 MG (50000 UNIT) CAPS capsule Take 1 capsule (50,000 Units total) by mouth every 7 (seven) days. 01/03/21   Allwardt, Crist Infante, PA-C    Family History  Family History  Problem Relation Age of Onset   Diabetes Mother    Arthritis Mother    Other Mother        auto immune disease   Depression Mother    Kidney disease Mother    Cancer Maternal Grandmother        cervical   Depression Maternal Grandmother    Cancer Maternal Grandfather        lung that went to brain   Other Paternal Grandfather        has a pacemaker    Social History Social History   Tobacco Use   Smoking status: Never   Smokeless tobacco: Never  Vaping Use   Vaping Use: Some days   Start date: 05/13/2014   Substances: Flavoring  Substance Use Topics   Alcohol use: Yes    Comment: Occas   Drug use: No     Allergies    Patient has no known allergies.   Review of Systems Review of Systems Per HPI  Physical Exam Triage Vital Signs ED Triage Vitals  Enc Vitals Group     BP 09/18/21 1646 109/73     Pulse Rate 09/18/21 1646 100     Resp 09/18/21 1646 18     Temp 09/18/21 1646 98.2 F (36.8 C)     Temp Source 09/18/21 1646 Oral     SpO2 09/18/21 1646 96 %     Weight --      Height --      Head Circumference --      Peak Flow --      Pain Score 09/18/21 1644 7     Pain Loc --      Pain Edu? --      Excl. in GC? --    No data found.  Updated Vital Signs BP 109/73 (BP Location: Right Arm)   Pulse 100   Temp 98.2 F (36.8 C) (Oral)   Resp 18   LMP 09/18/2021 (Exact Date)   SpO2 96%   Visual Acuity Right Eye Distance:   Left Eye Distance:   Bilateral Distance:    Right Eye Near:   Left Eye Near:    Bilateral Near:     Physical Exam Vitals and nursing note reviewed.  Constitutional:      Appearance: Normal appearance.  HENT:     Head: Atraumatic.     Right Ear: Tympanic membrane and external ear normal.     Left Ear: Tympanic membrane and external ear normal.     Nose: Rhinorrhea present.     Mouth/Throat:     Mouth: Mucous membranes are moist.     Pharynx: Posterior oropharyngeal erythema present.  Eyes:     Extraocular Movements: Extraocular movements intact.     Conjunctiva/sclera: Conjunctivae normal.  Cardiovascular:     Rate and Rhythm: Normal rate and regular rhythm.     Heart sounds: Normal heart sounds.  Pulmonary:     Effort: Pulmonary effort is normal.     Breath sounds: Normal breath sounds. No wheezing or rales.  Musculoskeletal:        General: Normal range of motion.     Cervical back: Normal range of motion and neck supple.  Skin:    General: Skin is warm and dry.  Neurological:     Mental Status: She is alert and oriented to person, place, and time.  Psychiatric:        Mood and Affect: Mood normal.  Thought Content: Thought content normal.       UC Treatments / Results  Labs (all labs ordered are listed, but only abnormal results are displayed) Labs Reviewed  RESP PANEL BY RT-PCR (FLU A&B, COVID) [id number]    EKG   Radiology No results found.  Procedures Procedures (including critical care time)  Medications Ordered in UC Medications - No data to display  Initial Impression / Assessment and Plan / UC Course  I have reviewed the triage vital signs and the nursing notes.  Pertinent labs & imaging results that were available during my care of the patient were reviewed by me and considered in my medical decision making (see chart for details).     Vitals and exam reassuring and suspicious for viral upper respiratory infection.  Respiratory panel pending, treat with Phenergan DM, supportive over-the-counter medications and home care.  We will also refill her Prozac for which she is stable on.  Return for any worsening symptoms.  Work note given.  Final Clinical Impressions(s) / UC Diagnoses   Final diagnoses:  Viral URI with cough  Anxiety and depression   Discharge Instructions   None    ED Prescriptions     Medication Sig Dispense Auth. Provider   FLUoxetine (PROZAC) 20 MG capsule Take 1 capsule (20 mg total) by mouth daily. 30 capsule Volney American, [place]   promethazine-dextromethorphan (PROMETHAZINE-DM) 6.25-15 MG/5ML syrup Take 5 mLs by mouth 4 (four) times daily as needed. 100 mL Volney American, [place]      PDMP not reviewed this encounter.   Volney American, [place] 09/18/21 1759

## 2021-09-18 NOTE — ED Triage Notes (Signed)
Pt states that she has cough and congestion started Friday. Now she is starting to feel like she needs to vomit and doesn't want to eat. She is taking sudafed, nasal spray.   She is also needing a refill of prozac.

## 2021-09-20 ENCOUNTER — Ambulatory Visit: Payer: Commercial Managed Care - PPO | Admitting: Physician Assistant

## 2021-09-25 ENCOUNTER — Encounter: Payer: Self-pay | Admitting: *Deleted

## 2021-12-14 ENCOUNTER — Encounter: Payer: Self-pay | Admitting: *Deleted

## 2022-01-09 ENCOUNTER — Emergency Department (HOSPITAL_COMMUNITY): Payer: Federal, State, Local not specified - PPO

## 2022-01-09 ENCOUNTER — Emergency Department (HOSPITAL_COMMUNITY)
Admission: EM | Admit: 2022-01-09 | Discharge: 2022-01-09 | Disposition: A | Discharge status: Home / Self Care | Payer: Federal, State, Local not specified - PPO | Attending: Emergency Medicine | Admitting: Emergency Medicine

## 2022-01-09 ENCOUNTER — Other Ambulatory Visit: Payer: Self-pay

## 2022-01-09 DIAGNOSIS — R0602 Shortness of breath: Secondary | ICD-10-CM | POA: Diagnosis not present

## 2022-01-09 DIAGNOSIS — J029 Acute pharyngitis, unspecified: Secondary | ICD-10-CM | POA: Diagnosis not present

## 2022-01-09 DIAGNOSIS — R509 Fever, unspecified: Secondary | ICD-10-CM | POA: Diagnosis not present

## 2022-01-09 DIAGNOSIS — Z6841 Body Mass Index (BMI) 40.0 and over, adult: Secondary | ICD-10-CM | POA: Diagnosis not present

## 2022-01-09 DIAGNOSIS — R0781 Pleurodynia: Secondary | ICD-10-CM

## 2022-01-09 DIAGNOSIS — R079 Chest pain, unspecified: Secondary | ICD-10-CM | POA: Diagnosis not present

## 2022-01-09 DIAGNOSIS — Z20828 Contact with and (suspected) exposure to other viral communicable diseases: Secondary | ICD-10-CM | POA: Diagnosis not present

## 2022-01-09 DIAGNOSIS — U071 COVID-19: Secondary | ICD-10-CM

## 2022-01-09 DIAGNOSIS — R7989 Other specified abnormal findings of blood chemistry: Secondary | ICD-10-CM

## 2022-01-09 DIAGNOSIS — R791 Abnormal coagulation profile: Secondary | ICD-10-CM | POA: Insufficient documentation

## 2022-01-09 DIAGNOSIS — R000 Tachycardia, unspecified: Secondary | ICD-10-CM | POA: Diagnosis not present

## 2022-01-09 LAB — COMPREHENSIVE METABOLIC PANEL
ALT: 48 U/L — ABNORMAL HIGH (ref 0–44)
AST: 33 U/L (ref 15–41)
Albumin: 4.1 g/dL (ref 3.5–5.0)
Alkaline Phosphatase: 52 U/L (ref 38–126)
Anion gap: 9 (ref 5–15)
BUN: 7 mg/dL (ref 6–20)
CO2: 23 mmol/L (ref 22–32)
Calcium: 8.5 mg/dL — ABNORMAL LOW (ref 8.9–10.3)
Chloride: 102 mmol/L (ref 98–111)
Creatinine, Ser: 0.84 mg/dL (ref 0.44–1.00)
GFR, Estimated: >60 mL/min (ref >60)
Glucose, Bld: 100 mg/dL — ABNORMAL HIGH (ref 70–99)
Potassium: 3.6 mmol/L (ref 3.5–5.1)
Sodium: 134 mmol/L — ABNORMAL LOW (ref 135–145)
Total Bilirubin: 1.4 mg/dL — ABNORMAL HIGH (ref 0.3–1.2)
Total Protein: 7.6 g/dL (ref 6.5–8.1)

## 2022-01-09 LAB — TROPONIN I (HIGH SENSITIVITY): Troponin I (High Sensitivity): <2 ng/L (ref <18)

## 2022-01-09 LAB — CBC WITH DIFFERENTIAL/PLATELET
Abs Immature Granulocytes: 0.05 10*3/uL (ref 0.00–0.07)
Basophils Absolute: 0 10*3/uL (ref 0.0–0.1)
Basophils Relative: 0 %
Eosinophils Absolute: 0.1 10*3/uL (ref 0.0–0.5)
Eosinophils Relative: 1 %
HCT: 36.4 % (ref 36.0–46.0)
Hemoglobin: 12 g/dL (ref 12.0–15.0)
Immature Granulocytes: 1 %
Lymphocytes Relative: 13 %
Lymphs Abs: 1 10*3/uL (ref 0.7–4.0)
MCH: 27.5 pg (ref 26.0–34.0)
MCHC: 33 g/dL (ref 30.0–36.0)
MCV: 83.3 fL (ref 80.0–100.0)
Monocytes Absolute: 0.6 10*3/uL (ref 0.1–1.0)
Monocytes Relative: 9 %
Neutro Abs: 5.5 10*3/uL (ref 1.7–7.7)
Neutrophils Relative %: 76 %
Platelets: 271 10*3/uL (ref 150–400)
RBC: 4.37 MIL/uL (ref 3.87–5.11)
RDW: 14.6 % (ref 11.5–15.5)
WBC: 7.3 10*3/uL (ref 4.0–10.5)
nRBC: 0 % (ref 0.0–0.2)

## 2022-01-09 LAB — D-DIMER, QUANTITATIVE: D-Dimer, Quant: 3.02 ug/mL-FEU — ABNORMAL HIGH (ref 0.00–0.50)

## 2022-01-09 MED ORDER — IBUPROFEN 400 MG PO TABS
600.0000 mg | ORAL_TABLET | Freq: Once | ORAL | Status: AC
  Administered 2022-01-09: 600 mg via ORAL
  Filled 2022-01-09: qty 2

## 2022-01-09 MED ORDER — IOHEXOL 350 MG/ML SOLN
100.0000 mL | Freq: Once | INTRAVENOUS | Status: AC | PRN
  Administered 2022-01-09: 100 mL via INTRAVENOUS

## 2022-01-09 MED ORDER — SODIUM CHLORIDE 0.9 % IV BOLUS
1000.0000 mL | Freq: Once | INTRAVENOUS | Status: AC
Start: 2022-01-09 — End: 2022-01-09
  Administered 2022-01-09: 1000 mL via INTRAVENOUS

## 2022-01-09 MED ORDER — IBUPROFEN 600 MG PO TABS: 600.0000 mg | ORAL_TABLET | Freq: Four times a day (QID) | ORAL | 0 refills | Status: DC | PRN

## 2022-01-09 MED ORDER — BENZONATATE 100 MG PO CAPS: 100.0000 mg | ORAL_CAPSULE | Freq: Three times a day (TID) | ORAL | 0 refills | Status: DC

## 2022-01-09 MED ORDER — BENZONATATE 100 MG PO CAPS: 100.0000 mg | ORAL_CAPSULE | Freq: Three times a day (TID) | ORAL | 0 refills | Status: DC | PRN

## 2022-01-09 MED ORDER — ONDANSETRON HCL 4 MG PO TABS: 4.0000 mg | ORAL_TABLET | Freq: Four times a day (QID) | ORAL | 0 refills | Status: DC

## 2022-01-09 NOTE — Discharge Instructions (Addendum)
Note the workup today was overall reassuring.  Negative for blood clot in the lung, heart attack, pneumonia, collapsed lung..  Recommend symptomatic therapy at home with cough suppressant in the form of Tessalon Perles or benzonatate as needed every 8 hours.  Take Advil for symptoms of chest pain, fever, body aches.  Take Zofran as needed for nausea/emesis.  Recommend follow-up with your primary care for reassessment.  Please do not hesitate to return to emergency department for worrisome signs and symptoms we discussed become apparent.

## 2022-01-09 NOTE — ED Provider Notes (Signed)
[facility] EMERGENCY DEPARTMENT Provider Note   CSN: [id number] Arrival date & time: 01/09/22  2027     History  Chief Complaint  Patient presents with   Covid Positive    Rebecca Gibbs is a 27 y.o. female.  HPI   27 year old female presents emergency department with complaints of chest pain, shortness of breath, cough, congestion.  Patient states that symptoms began insidiously 2 days ago.  Was seen by primary care earlier today and told to come to the emergency department due to concerns of chest pain, tachycardia and shortness of breath.  Patient states that chest pain is located substernally and is worsened with taking a deep breath as well as with certain movements.  She denies any known sick contacts.  Denies history of DVT/PE, current hormonal therapy, no malignancy, recent surgery/immobilization/travel.  Past medical history significant for diabetes mellitus, renal disease  Home Medications Prior to Admission medications   Medication Sig Start Date End Date Taking? Authorizing Provider  ibuprofen (ADVIL) 600 MG tablet Take 1 tablet (600 mg total) by mouth every 6 (six) hours as needed. 01/09/22  Yes Sherian Maroon A, PA  ondansetron (ZOFRAN) 4 MG tablet Take 1 tablet (4 mg total) by mouth every 6 (six) hours. 01/09/22  Yes Sherian Maroon A, PA  benzonatate (TESSALON) 100 MG capsule Take 1 capsule (100 mg total) by mouth 3 (three) times daily as needed for cough. 01/09/22   Peter Garter, PA  etonogestrel-ethinyl estradiol (NUVARING) 0.12-0.015 MG/24HR vaginal ring INSERT 1 RING VAGINALLY AS DIRECTED. REMOVE AFTER 3 WEEKS & WAIT 7 DAYS BEFORE INSERTING A NEW RING 05/30/21   Lazaro Arms, MD  FLUoxetine (PROZAC) 20 MG capsule Take 1 capsule (20 mg total) by mouth daily. 09/18/21   Particia Nearing, PA-C  Iron, Ferrous Sulfate, 325 (65 Fe) MG TABS Take 325 mg by mouth daily. Take with Vitamin C. 01/03/21 04/03/21  Allwardt, Alyssa M, PA-C  promethazine-dextromethorphan  (PROMETHAZINE-DM) 6.25-15 MG/5ML syrup Take 5 mLs by mouth 4 (four) times daily as needed for cough. Do not take while driving or operating heavy machinery 06/06/21   Valentino Nose, NP  promethazine-dextromethorphan (PROMETHAZINE-DM) 6.25-15 MG/5ML syrup Take 5 mLs by mouth 4 (four) times daily as needed. 09/18/21   Particia Nearing, PA-C  Vitamin D, Ergocalciferol, (DRISDOL) 1.25 MG (50000 UNIT) CAPS capsule Take 1 capsule (50,000 Units total) by mouth every 7 (seven) days. 01/03/21   Allwardt, Crist Infante, PA-C      Allergies    Patient has no known allergies.    Review of Systems   Review of Systems  All other systems reviewed and are negative.   Physical Exam Updated Vital Signs BP 113/63 (BP Location: Left Arm)   Pulse 92   Temp 99.4 F (37.4 C)   Resp 18   LMP 12/25/2021 (Approximate)   SpO2 99%  Physical Exam Vitals and nursing note reviewed.  Constitutional:      General: She is not in acute distress.    Appearance: She is well-developed.  HENT:     Head: Normocephalic and atraumatic.     Nose: Congestion and rhinorrhea present.  Eyes:     Conjunctiva/sclera: Conjunctivae normal.  Cardiovascular:     Rate and Rhythm: Regular rhythm. Tachycardia present.     Heart sounds: No murmur heard. Pulmonary:     Effort: Pulmonary effort is normal. No respiratory distress.     Breath sounds: Normal breath sounds. No wheezing, rhonchi or rales.  Abdominal:     Palpations: Abdomen is soft.     Tenderness: There is no abdominal tenderness.  Musculoskeletal:        General: No swelling.     Cervical back: Neck supple. No rigidity or tenderness.     Right lower leg: No edema.     Left lower leg: No edema.  Skin:    General: Skin is warm and dry.     Capillary Refill: Capillary refill takes less than 2 seconds.  Neurological:     Mental Status: She is alert.  Psychiatric:        Mood and Affect: Mood normal.     ED Results / Procedures / Treatments   Labs (all  labs ordered are listed, but only abnormal results are displayed) Labs Reviewed  COMPREHENSIVE METABOLIC PANEL - Abnormal; Notable for the following components:      Result Value   Sodium 134 (*)    Glucose, Bld 100 (*)    Calcium 8.5 (*)    ALT 48 (*)    Total Bilirubin 1.4 (*)    All other components within normal limits  D-DIMER, QUANTITATIVE - Abnormal; Notable for the following components:   D-Dimer, Quant 3.02 (*)    All other components within normal limits  CBC WITH DIFFERENTIAL/PLATELET  TROPONIN I (HIGH SENSITIVITY)  TROPONIN I (HIGH SENSITIVITY)    EKG EKG Interpretation  Date/Time:  Tuesday January 09 2022 20:50:16 EST Ventricular Rate:  121 PR Interval:  132 QRS Duration: 82 QT Interval:  290 QTC Calculation: 411 R Axis:   33 Text Interpretation: Sinus tachycardia Cannot rule out Anterior infarct , age undetermined Abnormal ECG No previous ECGs available Confirmed by Fredia Sorrow [phone]) on 01/09/2022 10:41:28 PM  Radiology CT Angio Chest PE W/Cm &/Or Wo Cm  Result Date: 01/09/2022 CLINICAL DATA:  Positive COVID fever elevated D-dimer EXAM: CT ANGIOGRAPHY CHEST WITH CONTRAST TECHNIQUE: Multidetector CT imaging of the chest was performed using the standard protocol during bolus administration of intravenous contrast. Multiplanar CT image reconstructions and MIPs were obtained to evaluate the vascular anatomy. RADIATION DOSE REDUCTION: This exam was performed according to the departmental dose-optimization program which includes automated exposure control, adjustment of the mA and/or kV according to patient size and/or use of iterative reconstruction technique. CONTRAST:  130mL OMNIPAQUE IOHEXOL 350 MG/ML SOLN COMPARISON:  Chest x-ray 01/09/2022 FINDINGS: Cardiovascular: Satisfactory opacification of the pulmonary arteries to the segmental level. No evidence of pulmonary embolism. Normal heart size. No pericardial effusion. Nonaneurysmal aorta. No dissection.  Mediastinum/Nodes: No enlarged mediastinal, hilar, or axillary lymph nodes. Thyroid gland, trachea, and esophagus demonstrate no significant findings. Lungs/Pleura: Lungs are clear. No pleural effusion or pneumothorax. Upper Abdomen: No acute abnormality. Musculoskeletal: No chest wall abnormality. No acute or significant osseous findings. Review of the MIP images confirms the above findings. IMPRESSION: Negative. No CT evidence for acute pulmonary embolus or aortic dissection. Clear lung fields. Electronically Signed   By: Donavan Foil M.D.   On: 01/09/2022 23:08   DG Chest 2 View  Result Date: 01/09/2022 CLINICAL DATA:  Shortness of breath and tachycardia with fever EXAM: CHEST - 2 VIEW COMPARISON:  01/26/2025 FINDINGS: The heart size and mediastinal contours are within normal limits. Both lungs are clear. The visualized skeletal structures are unremarkable. IMPRESSION: No active cardiopulmonary disease. Electronically Signed   By: Placido Sou M.D.   On: 01/09/2022 21:31    Procedures Procedures    Medications Ordered in ED Medications  sodium chloride 0.9 %  bolus 1,000 mL (0 mLs Intravenous Stopped 01/09/22 2227)  ibuprofen (ADVIL) tablet 600 mg (600 mg Oral Given 01/09/22 2151)  iohexol (OMNIPAQUE) 350 MG/ML injection 100 mL (100 mLs Intravenous Contrast Given 01/09/22 2251)    ED Course/ Medical Decision Making/ A&P Clinical Course as of 01/09/22 2333  Tue Jan 09, 2022  2234 D-dimer, quantitative(!) D-dimer 3.02.  CT angio chest PE ordered. [CR]    Clinical Course User Index [CR] Peter Garter, PA                           Medical Decision Making Amount and/or Complexity of Data Reviewed Labs: ordered. Decision-making details documented in ED Course. Radiology: ordered.  Risk Prescription drug management.   This patient presents to the ED for concern of influenza-like illness, this involves an extensive number of treatment options, and is a complaint that carries with it a  high risk of complications and morbidity.  The differential diagnosis includes influenza, COVID, RSV, PE, ACS, pneumothorax, pneumonia   Co morbidities that complicate the patient evaluation  See HPI   Additional history obtained:  Additional history obtained from EMR External records from outside source obtained and reviewed including hospital records   Lab Tests:  I Ordered, and personally interpreted labs.  The pertinent results include: No leukocytosis noted.  No evidence anemia.  Platelets within normal range.  Mild hyponatremia with sodium 134.  Otherwise, electrolytes within normal limits.  No transaminitis.  Nonspecific elevation of total bilirubin of 1.4.  No renal dysfunction.  Initial troponin of less than 2; EKG positive for sinus tachycardia with nonspecific ST/T wave changes in septal and anterior leads.   Imaging Studies ordered:  I ordered imaging studies including chest x-ray/CT angio chest PE I independently visualized and interpreted imaging which showed  Chest x-ray: No acute cardiopulmonary process CT angio chest PE: Negative.  No evidence of acute pulmonary embolism or aortic dissection.  Clear lung fields. I agree with the radiologist interpretation  Cardiac Monitoring: / EKG:  The patient was maintained on a cardiac monitor.  I personally viewed and interpreted the cardiac monitored which showed an underlying rhythm of: Sinus tachycardia with nonspecific ST/T wave changes in anterior and septal leads.   Consultations Obtained:  N/a   Problem List / ED Course / Critical interventions / Medication management  COVID-19/pleuritic chest pain/elevated D-dimer I ordered medication including ibuprofen for pain   Reevaluation of the patient after these medicines showed that the patient improved I have reviewed the patients home medicines and have made adjustments as needed   Social Determinants of Health:  Denies tobacco, licit drug use   Test /  Admission - Considered:  COVID-19/pleuritic chest pain/elevated D-dimer Vitals signs significant for vital signs initially significant for oral temp 99.5, heart rate 125 as well as hypertension of 141/96.  After time elapsed and medicines administered in the emergency department, heart rate decreased to 92. Otherwise within normal range and stable throughout visit. Laboratory/imaging studies significant for: See above Patient without evidence of acute pulmonary embolism.  Given elevation of D-dimer, I offered ultrasound of lower extremities to be performed outpatient given ultrasounds unavailability currently the patient elected to defer.  Doubt ACS given negative troponin and reassuring EKG.  Doubt pneumothorax, pneumonia, aortic dissection.  Patient is having pleuritic type chest pain with after response to NSAIDs administered on emergency department.  Recommend continued outpatient therapies of the same with NSAIDs as needed for chest pain.  Otherwise, symptomatic therapy recommended with daily antihistamine, nasal steroid spray, cough suppressant as needed.  Treatment plan discussed at length with patient and she acknowledged understanding was agreeable to said plan.  Patient recommended follow-up with primary care for reassessment. Worrisome signs and symptoms were discussed with the patient and the patient acknowledged understanding to return to emergency department if notice.  Patient stable upon discharge from the hospital.        Final Clinical Impression(s) / ED Diagnoses Final diagnoses:  Pleuritic chest pain  COVID  Elevated d-dimer    Rx / DC Orders ED Discharge Orders          Ordered    benzonatate (TESSALON) 100 MG capsule  Every 8 hours,   Status:  Discontinued        01/09/22 2318    ondansetron (ZOFRAN) 4 MG tablet  Every 6 hours        01/09/22 2318    ibuprofen (ADVIL) 600 MG tablet  Every 6 hours PRN        01/09/22 2318    benzonatate (TESSALON) 100 MG capsule  3  times daily PRN        01/09/22 2318              Peter Garter, [place] 01/09/22 2333    Rebecca Mulders, MD 01/12/22 2045

## 2022-01-09 NOTE — ED Triage Notes (Signed)
Pt presents after being seen at [facility] today about 1700.  Pt had a positive covid test there and was told to come to the ED d/t being tachycardic and shob.  Pt had reported fever there of 103.

## 2022-01-09 NOTE — ED Notes (Signed)
Patient transported to X-ray 

## 2022-01-31 ENCOUNTER — Other Ambulatory Visit: Payer: Self-pay | Admitting: Physician Assistant

## 2022-01-31 ENCOUNTER — Telehealth: Payer: Self-pay | Admitting: Physician Assistant

## 2022-01-31 MED ORDER — AMOXICILLIN 500 MG PO CAPS: 500.0000 mg | ORAL_CAPSULE | Freq: Two times a day (BID) | ORAL | 0 refills | Status: AC

## 2022-01-31 NOTE — Telephone Encounter (Signed)
Please see call msg and advise

## 2022-01-31 NOTE — Telephone Encounter (Signed)
Patient states daughter was seen yesterday and dx with strept. States Alyssa informed her that if she began showing symptoms, to contact us for next steps. Pt is requesting a med be sent in. Please advise.

## 2022-02-02 NOTE — Telephone Encounter (Signed)
Called pt Mom and left vm Rx sent to pharmacy

## 2022-04-17 ENCOUNTER — Telehealth: Payer: Self-pay | Admitting: Physician Assistant

## 2022-04-17 NOTE — Telephone Encounter (Signed)
Prescription Request  04/17/2022  LOV: 12/22/20 Next Visit: 04/27/22 @ 7:30am  What is the name of the medication or equipment? FLUoxetine (PROZAC) 20 MG capsule   Have you contacted your pharmacy to request a refill? No   Which pharmacy would you like this sent to?   [facility] @ [address] [place], [place], [place] [address]    Patient notified that their request is being sent to the clinical staff for review and that they should receive a response within 2 business days.   Please advise at Mobile [phone] (mobile)

## 2022-04-18 ENCOUNTER — Other Ambulatory Visit: Payer: Self-pay

## 2022-04-18 MED ORDER — FLUOXETINE HCL 20 MG PO CAPS: 20.0000 mg | ORAL_CAPSULE | Freq: Every day | ORAL | 0 refills | Status: DC

## 2022-04-18 NOTE — Telephone Encounter (Signed)
Patient has upcoming appt but not been seen in over 1 yr. Send refill or wait till after appt

## 2022-04-18 NOTE — Telephone Encounter (Signed)
30 day Rx sent to pharmacy pt requested in call note

## 2022-04-27 ENCOUNTER — Ambulatory Visit: Payer: Federal, State, Local not specified - PPO | Admitting: Physician Assistant

## 2022-05-12 ENCOUNTER — Other Ambulatory Visit: Payer: Self-pay | Admitting: Physician Assistant

## 2022-05-14 ENCOUNTER — Encounter: Payer: Self-pay | Admitting: Physician Assistant

## 2022-05-24 ENCOUNTER — Encounter: Payer: Self-pay | Admitting: Physician Assistant

## 2022-05-24 ENCOUNTER — Ambulatory Visit: Payer: Federal, State, Local not specified - PPO | Admitting: Physician Assistant

## 2022-05-24 ENCOUNTER — Ambulatory Visit (INDEPENDENT_AMBULATORY_CARE_PROVIDER_SITE_OTHER): Payer: Federal, State, Local not specified - PPO | Admitting: Physician Assistant

## 2022-05-24 VITALS — BP 109/75 | HR 75 | Temp 98.0°F | Resp 16 | Ht 65.0 in | Wt 248.8 lb

## 2022-05-24 DIAGNOSIS — Z0000 Encounter for general adult medical examination without abnormal findings: Secondary | ICD-10-CM

## 2022-05-24 DIAGNOSIS — Z124 Encounter for screening for malignant neoplasm of cervix: Secondary | ICD-10-CM

## 2022-05-24 LAB — COMPREHENSIVE METABOLIC PANEL
ALT: 67 U/L — ABNORMAL HIGH (ref 0–35)
AST: 41 U/L — ABNORMAL HIGH (ref 0–37)
Albumin: 4 g/dL (ref 3.5–5.2)
Alkaline Phosphatase: 50 U/L (ref 39–117)
BUN: 12 mg/dL (ref 6–23)
CO2: 30 mEq/L (ref 19–32)
Calcium: 9.2 mg/dL (ref 8.4–10.5)
Chloride: 104 mEq/L (ref 96–112)
Creatinine, Ser: 0.84 mg/dL (ref 0.40–1.20)
GFR: 96.43 mL/min (ref >60.00)
Glucose, Bld: 97 mg/dL (ref 70–99)
Potassium: 4.5 mEq/L (ref 3.5–5.1)
Sodium: 141 mEq/L (ref 135–145)
Total Bilirubin: 0.6 mg/dL (ref 0.2–1.2)
Total Protein: 7.1 g/dL (ref 6.0–8.3)

## 2022-05-24 LAB — TSH: TSH: 0.8 u[IU]/mL (ref 0.35–5.50)

## 2022-05-24 LAB — CBC WITH DIFFERENTIAL/PLATELET
Basophils Absolute: 0.1 10*3/uL (ref 0.0–0.1)
Basophils Relative: 0.7 % (ref 0.0–3.0)
Eosinophils Absolute: 0.3 10*3/uL (ref 0.0–0.7)
Eosinophils Relative: 3.1 % (ref 0.0–5.0)
HCT: 33.8 % — ABNORMAL LOW (ref 36.0–46.0)
Hemoglobin: 11.5 g/dL — ABNORMAL LOW (ref 12.0–15.0)
Lymphocytes Relative: 38.4 % (ref 12.0–46.0)
Lymphs Abs: 3.3 10*3/uL (ref 0.7–4.0)
MCHC: 34.2 g/dL (ref 30.0–36.0)
MCV: 82 fl (ref 78.0–100.0)
Monocytes Absolute: 0.6 10*3/uL (ref 0.1–1.0)
Monocytes Relative: 7.6 % (ref 3.0–12.0)
Neutro Abs: 4.3 10*3/uL (ref 1.4–7.7)
Neutrophils Relative %: 50.2 % (ref 43.0–77.0)
Platelets: 383 10*3/uL (ref 150.0–400.0)
RBC: 4.13 Mil/uL (ref 3.87–5.11)
RDW: 15.7 % — ABNORMAL HIGH (ref 11.5–15.5)
WBC: 8.5 10*3/uL (ref 4.0–10.5)

## 2022-05-24 MED ORDER — SEMAGLUTIDE-WEIGHT MANAGEMENT 0.25 MG/0.5ML ~~LOC~~ SOAJ: 0.2500 mg | SUBCUTANEOUS | 0 refills | Status: DC

## 2022-05-24 NOTE — Progress Notes (Signed)
Subjective:    Patient ID: Rebecca Gibbs, female    DOB: February 11, 1995, 27 y.o.   MRN: [id number]  Chief Complaint  Patient presents with   Annual Exam    Non-fasting    HPI Patient is in today for annual exam. She is needing forms completed for nursing school.   Health maintenance: Lifestyle/ exercise: Planning on gym time in the mornings soon, otherwise minimal exercise.  Nutrition: Needs improvement Mental health: Currently on Prozac 20 mg  Sleep: Does well overall  Substance use: Some vaping ETOH: Occasional  Sexual activity: Not active Immunizations: UTD  Pap: Unsure she has ever had one done; formerly established with [facility] Periods are regular.   Past Medical History:  Diagnosis Date   Diabetes mellitus without complication (HCC)    Gestational DM, on Glyburide   Pregnant 01/07/2014   Raynaud's disease     Past Surgical History:  Procedure Laterality Date   CHOLECYSTECTOMY N/A 05/19/2018   Procedure: LAPAROSCOPIC CHOLECYSTECTOMY;  Surgeon: [name], MD;  Location: [facility];  Service: General;  Laterality: N/A;   DILATION AND CURETTAGE OF UTERUS N/A 04/30/2019   Procedure: SUCTION DILATATION AND CURETTAGE;  Surgeon: Lazaro Arms, MD;  Location: [facility];  Service: Gynecology;  Laterality: N/A;   TONSILLECTOMY      Family History  Problem Relation Age of Onset   Diabetes Mother    Arthritis Mother    Other Mother        auto immune disease   Depression Mother    Kidney disease Mother    Cancer Maternal Grandmother        cervical   Depression Maternal Grandmother    Cancer Maternal Grandfather        lung that went to brain   Other Paternal Grandfather        has a pacemaker    Social History   Tobacco Use   Smoking status: Never   Smokeless tobacco: Never  Vaping Use   Vaping Use: Some days   Start date: 05/13/2014   Substances: Flavoring  Substance Use Topics   Alcohol use: Yes    Comment: Occas   Drug use: No     No Known  Allergies  Review of Systems NEGATIVE UNLESS OTHERWISE INDICATED IN HPI      Objective:     BP 109/75   Pulse 75   Temp 98 F (36.7 C) (Temporal)   Resp 16   Ht 5\' 5"  (1.651 m)   Wt 248 lb 12.8 oz (112.9 kg)   LMP 05/10/2022 (Approximate)   SpO2 100%   BMI 41.40 kg/m   Wt Readings from Last 3 Encounters:  05/24/22 248 lb 12.8 oz (112.9 kg)  12/22/20 239 lb 3.2 oz (108.5 kg)  05/18/19 237 lb (107.5 kg)    BP Readings from Last 3 Encounters:  05/24/22 109/75  01/09/22 113/63  09/18/21 109/73     Physical Exam Vitals and nursing note reviewed.  Constitutional:      Appearance: Normal appearance. She is obese. She is not toxic-appearing.  HENT:     Head: Normocephalic and atraumatic.     Right Ear: Tympanic membrane, ear canal and external ear normal.     Left Ear: Tympanic membrane, ear canal and external ear normal.     Nose: Nose normal.     Mouth/Throat:     Mouth: Mucous membranes are moist.  Eyes:     Extraocular Movements: Extraocular movements intact.  Conjunctiva/sclera: Conjunctivae normal.     Pupils: Pupils are equal, round, and reactive to light.  Cardiovascular:     Rate and Rhythm: Normal rate and regular rhythm.     Pulses: Normal pulses.     Heart sounds: Normal heart sounds.  Pulmonary:     Effort: Pulmonary effort is normal.     Breath sounds: Normal breath sounds.  Abdominal:     General: Abdomen is flat. Bowel sounds are normal.     Palpations: Abdomen is soft.  Musculoskeletal:        General: Normal range of motion.     Cervical back: Normal range of motion and neck supple.  Skin:    General: Skin is warm and dry.  Neurological:     General: No focal deficit present.     Mental Status: She is alert and oriented to person, place, and time.  Psychiatric:        Mood and Affect: Mood normal.        Behavior: Behavior normal.        Thought Content: Thought content normal.        Judgment: Judgment normal.        Assessment  & Plan:  Encounter for annual physical exam -     CBC with Differential/Platelet -     Comprehensive metabolic panel -     TSH  Pap smear for cervical cancer screening -     Ambulatory referral to Gynecology  Obesity, Class III, BMI 40-49.9 (morbid obesity) (HCC) -     Semaglutide-Weight Management; Inject 0.25 mg into the skin once a week.  Dispense: 2 mL; Refill: 0   Age-appropriate screening and counseling performed today. Will check labs and call with results. Preventive measures discussed and printed in AVS for patient.   Patient Counseling: [x]   Nutrition: Stressed importance of moderation in sodium/caffeine intake, saturated fat and cholesterol, caloric balance, sufficient intake of fresh fruits, vegetables, and fiber.  [x]   Stressed the importance of regular exercise.   [x]   Substance Abuse: Discussed cessation/primary prevention of tobacco, alcohol, or other drug use; driving or other dangerous activities under the influence; availability of treatment for abuse.   []   Injury prevention: Discussed safety belts, safety helmets, smoke detector, smoking near bedding or upholstery.   [x]   Sexuality: Discussed sexually transmitted diseases, partner selection, use of condoms, avoidance of unintended pregnancy  and contraceptive alternatives.   [x]   Dental health: Discussed importance of regular tooth brushing, flossing, and dental visits.  [x]   Health maintenance and immunizations reviewed. Please refer to Health maintenance section.     Strongly encouraged pt to work on lifestyle changes. Plan to start on Wegovy for additional help with weight loss. Pt aware of risks vs benefits and possible adverse reactions. Pt to let me know in the next few weeks how she is doing. She has a long commute to our office, so I'm ok with MyChart / virtual follow-up.     Return in about 1 year (around 05/24/2023) for physical, fasting labs .   Lisia Westbay M Anup Brigham, PA-C

## 2022-05-25 ENCOUNTER — Telehealth: Payer: Self-pay

## 2022-05-25 NOTE — Telephone Encounter (Signed)
Rebecca Gibbs ([name]) Rx #: [id number] 0.25MG /0.5ML auto-injectors  Approved:  Authorization Expiration Date: November 21, 2022.

## 2022-05-30 ENCOUNTER — Other Ambulatory Visit: Payer: Self-pay

## 2022-05-30 DIAGNOSIS — D519 Vitamin B12 deficiency anemia, unspecified: Secondary | ICD-10-CM

## 2022-05-30 DIAGNOSIS — R7989 Other specified abnormal findings of blood chemistry: Secondary | ICD-10-CM

## 2022-06-05 NOTE — Telephone Encounter (Signed)
Please see pt request and advise 

## 2022-06-07 ENCOUNTER — Telehealth: Payer: Self-pay | Admitting: Obstetrics & Gynecology

## 2022-06-07 NOTE — Telephone Encounter (Signed)
Pt states she needs a refill for a nuvaring

## 2022-06-11 ENCOUNTER — Encounter: Payer: Self-pay | Admitting: Physician Assistant

## 2022-06-12 ENCOUNTER — Other Ambulatory Visit: Payer: Self-pay | Admitting: Obstetrics & Gynecology

## 2022-06-12 MED ORDER — ETONOGESTREL-ETHINYL ESTRADIOL 0.12-0.015 MG/24HR VA RING: VAGINAL_RING | VAGINAL | 12 refills | Status: DC

## 2022-06-22 ENCOUNTER — Other Ambulatory Visit: Payer: Self-pay | Admitting: Physician Assistant

## 2022-07-03 ENCOUNTER — Ambulatory Visit: Payer: Federal, State, Local not specified - PPO | Admitting: Obstetrics & Gynecology

## 2022-07-06 ENCOUNTER — Ambulatory Visit (INDEPENDENT_AMBULATORY_CARE_PROVIDER_SITE_OTHER): Payer: Federal, State, Local not specified - PPO

## 2022-07-06 ENCOUNTER — Ambulatory Visit
Admission: EM | Admit: 2022-07-06 | Discharge: 2022-07-06 | Disposition: A | Discharge status: Home / Self Care | Payer: Federal, State, Local not specified - PPO | Attending: Nurse Practitioner | Admitting: Nurse Practitioner

## 2022-07-06 DIAGNOSIS — S93601A Unspecified sprain of right foot, initial encounter: Secondary | ICD-10-CM

## 2022-07-06 DIAGNOSIS — S93401A Sprain of unspecified ligament of right ankle, initial encounter: Secondary | ICD-10-CM

## 2022-07-06 DIAGNOSIS — S8011XA Contusion of right lower leg, initial encounter: Secondary | ICD-10-CM | POA: Diagnosis not present

## 2022-07-06 NOTE — ED Triage Notes (Signed)
Pt c/o fall causing  right leg pain, pt states she fell through the  porch last night, pt states she has been limping, there is swelling to the right leg

## 2022-07-06 NOTE — Discharge Instructions (Addendum)
The x-rays were negative for fracture or dislocation of the right foot and ankle. Symptoms appear to be consistent with a sprain of the right ankle and right foot along with a contusion of the right lower leg. You have been provided an ankle brace and postop shoe to provide compression and support.  Recommend using the brace in the postop shoe for prolonged or strenuous activity. May take over-the-counter Tylenol or ibuprofen as needed for pain or discomfort. RICE therapy, rest, ice, compression, and elevation. If symptoms or not improving over the next 1 to 2 weeks, it is recommended that you follow-up with orthopedics for further evaluation.  I provided information for [facility] and for [facility]. Follow-up as needed.

## 2022-07-06 NOTE — ED Provider Notes (Signed)
[facility]    CSN: [id number] Arrival date & time: 07/06/22  1642      History   Chief Complaint No chief complaint on file.   HPI Rebecca Gibbs is a 27 y.o. female.   The history is provided by the patient.   The patient presents for complaints of right foot, right ankle, and right lower leg pain after she fell through a porch 1 day ago.  Patient states that she landed on her foot and possibly rolled her ankle outward.  She complains of pain with weightbearing, she also has a bruise to the right lower extremity.  Patient states that she is also experiencing swelling, and she did not notice it until she realized that the anklet on her right lower extremity was tight.  She states that she did apply ice to the right lower extremity last evening.  She denies bruising to the right foot and ankle, radiation of pain, or inability to bear weight.    Past Medical History:  Diagnosis Date   Diabetes mellitus without complication (HCC)    Gestational DM, on Glyburide   Pregnant 01/07/2014   Raynaud's disease     Patient Active Problem List   Diagnosis Date Noted   Calculus of gallbladder without cholecystitis without obstruction 05/08/2018   Post partum depression 07/28/2015   Term pregnancy 08/31/2014   Gestational diabetes mellitus, class A2 07/15/2014   Pregnant 01/07/2014    Past Surgical History:  Procedure Laterality Date   CHOLECYSTECTOMY N/A 05/19/2018   Procedure: LAPAROSCOPIC CHOLECYSTECTOMY;  Surgeon: [name], MD;  Location: [facility];  Service: General;  Laterality: N/A;   DILATION AND CURETTAGE OF UTERUS N/A 04/30/2019   Procedure: SUCTION DILATATION AND CURETTAGE;  Surgeon: Lazaro Arms, MD;  Location: [facility];  Service: Gynecology;  Laterality: N/A;   TONSILLECTOMY      OB History     Gravida  3   Para  1   Term  1   Preterm      AB  2   Living  1      SAB  1   IAB      Ectopic      Multiple  0   Live Births  1             Home Medications    Prior to Admission medications   Medication Sig Start Date End Date Taking? Authorizing Provider  etonogestrel-ethinyl estradiol (NUVARING) 0.12-0.015 MG/24HR vaginal ring Insert vaginally and leave in place for 3 consecutive weeks, then remove for 1 week. 06/12/22   Lazaro Arms, MD  FLUoxetine (PROZAC) 20 MG capsule TAKE 1 CAPSULE BY MOUTH EVERY DAY 05/14/22   Allwardt, Alyssa M, PA-C  ibuprofen (ADVIL) 600 MG tablet Take 1 tablet (600 mg total) by mouth every 6 (six) hours as needed. 01/09/22   Peter Garter, PA  Semaglutide-Weight Management (WEGOVY) 0.25 MG/0.5ML SOAJ INJECT 0.25MG  INTO THE SKIN ONE TIME PER WEEK 06/22/22   Allwardt, Crist Infante, PA-C    Family History Family History  Problem Relation Age of Onset   Diabetes Mother    Arthritis Mother    Other Mother        auto immune disease   Depression Mother    Kidney disease Mother    Cancer Maternal Grandmother        cervical   Depression Maternal Grandmother    Cancer Maternal Grandfather        lung  that went to brain   Other Paternal Grandfather        has a pacemaker    Social History Social History   Tobacco Use   Smoking status: Never   Smokeless tobacco: Never  Vaping Use   Vaping Use: Some days   Start date: 05/13/2014   Substances: Flavoring  Substance Use Topics   Alcohol use: Yes    Comment: Occas   Drug use: No     Allergies   Patient has no known allergies.   Review of Systems Review of Systems Per HPI  Physical Exam Triage Vital Signs ED Triage Vitals  Enc Vitals Group     BP 07/06/22 1727 118/75     Pulse Rate 07/06/22 1727 72     Resp 07/06/22 1727 17     Temp 07/06/22 1727 98.2 F (36.8 C)     Temp Source 07/06/22 1727 Oral     SpO2 07/06/22 1727 99 %     Weight --      Height --      Head Circumference --      Peak Flow --      Pain Score 07/06/22 1731 6     Pain Loc --      Pain Edu? --      Excl. in GC? --    No data found.  Updated  Vital Signs BP 118/75 (BP Location: Right Arm)   Pulse 72   Temp 98.2 F (36.8 C) (Oral)   Resp 17   LMP 06/02/2022 (Within Weeks)   SpO2 99%   Visual Acuity Right Eye Distance:   Left Eye Distance:   Bilateral Distance:    Right Eye Near:   Left Eye Near:    Bilateral Near:     Physical Exam Vitals and nursing note reviewed.  Constitutional:      General: She is not in acute distress.    Appearance: Normal appearance.  HENT:     Head: Normocephalic.  Eyes:     Extraocular Movements: Extraocular movements intact.     Pupils: Pupils are equal, round, and reactive to light.  Pulmonary:     Effort: Pulmonary effort is normal.  Musculoskeletal:     Cervical back: Normal range of motion.     Right lower leg: Swelling and tenderness (Tenderness noted to the medial aspect of the right lower leg under the kneecap.  Bruising is present.) present. No deformity or lacerations.     Right ankle: Swelling present. No deformity or ecchymosis. Tenderness present over the lateral malleolus. Decreased range of motion. Normal pulse.     Right foot: Decreased range of motion. Normal capillary refill. Swelling and tenderness (Generalized surface area of the foot) present. No deformity. Normal pulse.  Skin:    General: Skin is warm and dry.  Neurological:     General: No focal deficit present.     Mental Status: She is alert and oriented to person, place, and time.  Psychiatric:        Mood and Affect: Mood normal.        Behavior: Behavior normal.      UC Treatments / Results  Labs (all labs ordered are listed, but only abnormal results are displayed) Labs Reviewed - No data to display  EKG   Radiology DG Foot Complete Right  Result Date: 07/06/2022 CLINICAL DATA:  Fall through porch with foot pain, initial encounter EXAM: RIGHT FOOT COMPLETE - 3+ VIEW COMPARISON:  None Available.  FINDINGS: There is no evidence of fracture or dislocation. There is no evidence of arthropathy or  other focal bone abnormality. Soft tissues are unremarkable. IMPRESSION: No acute abnormality noted. Electronically Signed   By: Alcide Clever M.D.   On: 07/06/2022 18:04   DG Ankle Complete Right  Result Date: 07/06/2022 CLINICAL DATA:  Foot went through porch yesterday with lateral ankle pain, initial encounter EXAM: RIGHT ANKLE - COMPLETE 3+ VIEW COMPARISON:  None Available. FINDINGS: Mild soft tissue swelling is noted. No acute fracture or dislocation is noted. IMPRESSION: Soft tissue swelling without acute bony abnormality. Electronically Signed   By: Alcide Clever M.D.   On: 07/06/2022 18:02    Procedures Procedures (including critical care time)  Medications Ordered in UC Medications - No data to display  Initial Impression / Assessment and Plan / UC Course  I have reviewed the triage vital signs and the nursing notes.  Pertinent labs & imaging results that were available during my care of the patient were reviewed by me and considered in my medical decision making (see chart for details).  The patient is well-appearing, she is in no acute distress, vital signs are stable.  X-ray of the right foot and ankle are negative for fracture or dislocation.  Symptoms appear to be consistent with a sprain of the right ankle and foot.  She does have a contusion to the right lower extremity.  Lace up ankle brace and postop shoe were provided to provide additional compression and support.  Supportive care recommendations were provided and discussed with the patient to include RICE therapy, use of over-the-counter analgesics for pain or discomfort, and range of motion exercises.  Patient was advised that if symptoms or not improving over the next 1 to 2 weeks, it is recommended that she follow-up with orthopedics for further evaluation.  Patient was in agreement with this plan of care and verbalizes understanding.  All questions were answered.  Patient is stable for discharge.  Final Clinical  Impressions(s) / UC Diagnoses   Final diagnoses:  Sprain of right foot, initial encounter  Sprain of right ankle, unspecified ligament, initial encounter  Contusion of right lower leg, initial encounter     Discharge Instructions      The x-rays were negative for fracture or dislocation of the right foot and ankle. Symptoms appear to be consistent with a sprain of the right ankle and right foot along with a contusion of the right lower leg. You have been provided an ankle brace and postop shoe to provide compression and support.  Recommend using the brace in the postop shoe for prolonged or strenuous activity. May take over-the-counter Tylenol or ibuprofen as needed for pain or discomfort. RICE therapy, rest, ice, compression, and elevation. If symptoms or not improving over the next 1 to 2 weeks, it is recommended that you follow-up with orthopedics for further evaluation.  I provided information for [facility] [place] and for [facility]. Follow-up as needed.     ED Prescriptions   None    PDMP not reviewed this encounter.   Abran Cantor, NP 07/06/22 1820

## 2022-08-02 ENCOUNTER — Ambulatory Visit: Payer: Self-pay

## 2022-08-20 ENCOUNTER — Ambulatory Visit: Payer: Federal, State, Local not specified - PPO | Admitting: Obstetrics & Gynecology

## 2022-09-07 ENCOUNTER — Ambulatory Visit
Admission: EM | Admit: 2022-09-07 | Discharge: 2022-09-07 | Disposition: A | Discharge status: Home / Self Care | Payer: Federal, State, Local not specified - PPO | Attending: Physician Assistant | Admitting: Physician Assistant

## 2022-09-07 DIAGNOSIS — N898 Other specified noninflammatory disorders of vagina: Secondary | ICD-10-CM | POA: Insufficient documentation

## 2022-09-07 DIAGNOSIS — N760 Acute vaginitis: Secondary | ICD-10-CM

## 2022-09-07 DIAGNOSIS — F17290 Nicotine dependence, other tobacco product, uncomplicated: Secondary | ICD-10-CM | POA: Insufficient documentation

## 2022-09-07 DIAGNOSIS — Z113 Encounter for screening for infections with a predominantly sexual mode of transmission: Secondary | ICD-10-CM | POA: Diagnosis not present

## 2022-09-07 DIAGNOSIS — R300 Dysuria: Secondary | ICD-10-CM | POA: Diagnosis not present

## 2022-09-07 LAB — POCT URINALYSIS DIP (MANUAL ENTRY)
Glucose, UA: NEGATIVE mg/dL
Ketones, POC UA: NEGATIVE mg/dL
Nitrite, UA: NEGATIVE
Spec Grav, UA: >=1.03 — AB (ref 1.010–1.025)
Urobilinogen, UA: 2 U/dL — AB
pH, UA: 5.5 (ref 5.0–8.0)

## 2022-09-07 LAB — POCT URINE PREGNANCY: Preg Test, Ur: NEGATIVE

## 2022-09-07 MED ORDER — METRONIDAZOLE 500 MG PO TABS: 500.0000 mg | ORAL_TABLET | Freq: Two times a day (BID) | ORAL | 0 refills | Status: DC

## 2022-09-07 MED ORDER — FLUCONAZOLE 150 MG PO TABS: 150.0000 mg | ORAL_TABLET | ORAL | 0 refills | Status: AC

## 2022-09-07 NOTE — Discharge Instructions (Signed)
Your urine had some white blood cells but I believe this is more because of a vaginal infection.  We will send this for culture and contact you if we need to start additional antibiotics.  We are treating for a vaginal infection.  Please start metronidazole twice daily for 7 days.  Do not drink any alcohol while on this medication and for 3 days after completing course.  Take Diflucan today and a second dose in 1 week.  Wear loosefitting cotton underwear and use hypotonic soaps and detergents.  We will contact you if need to arrange additional treatment.  If anything worsens or changes and you have severe pain, fever, nausea, vomiting you need to be seen immediately.

## 2022-09-07 NOTE — ED Provider Notes (Signed)
[facility]    CSN: [id number] Arrival date & time: 09/07/22  1227      History   Chief Complaint No chief complaint on file.   HPI Rebecca Gibbs is a 27 y.o. female.   Patient presents today with a 2-day history of UTI symptoms including frequent urination, dysuria, lower abdominal pain.  She denies any fever, nausea, vomiting, hematuria.  Does report that today she developed some vaginal discharge but denies any additional vaginal symptoms including pain, abnormal bleeding, pelvic pain.  She has not tried any over-the-counter medications for symptom management.  She is sexually active but is no specific concern for STI.  She is interested in testing to be sure.  Denies any recent antibiotics or steroids.  She does have a history of gestational diabetes but denies ongoing history of diabetes or SGLT2 inhibitor use.  She denies any recent urogenital procedures, self-catheterization, history of nephrolithiasis.  Reports that her symptoms began soon after she spilled Coca-Cola on herself and had to sit in wet clothing covered in Coca-Cola for a while before she was able to change.    Past Medical History:  Diagnosis Date   Diabetes mellitus without complication (HCC)    Gestational DM, on Glyburide   Pregnant 01/07/2014   Raynaud's disease     Patient Active Problem List   Diagnosis Date Noted   Calculus of gallbladder without cholecystitis without obstruction 05/08/2018   Post partum depression 07/28/2015   Term pregnancy 08/31/2014   Gestational diabetes mellitus, class A2 07/15/2014   Pregnant 01/07/2014    Past Surgical History:  Procedure Laterality Date   CHOLECYSTECTOMY N/A 05/19/2018   Procedure: LAPAROSCOPIC CHOLECYSTECTOMY;  Surgeon: [name], MD;  Location: [facility];  Service: General;  Laterality: N/A;   DILATION AND CURETTAGE OF UTERUS N/A 04/30/2019   Procedure: SUCTION DILATATION AND CURETTAGE;  Surgeon: Lazaro Arms, MD;  Location: [facility];   Service: Gynecology;  Laterality: N/A;   TONSILLECTOMY      OB History     Gravida  3   Para  1   Term  1   Preterm      AB  2   Living  1      SAB  1   IAB      Ectopic      Multiple  0   Live Births  1            Home Medications    Prior to Admission medications   Medication Sig Start Date End Date Taking? Authorizing Provider  fluconazole (DIFLUCAN) 150 MG tablet Take 1 tablet (150 mg total) by mouth once a week for 2 doses. 09/07/22 09/15/22 Yes Jael Waldorf K, PA-C  metroNIDAZOLE (FLAGYL) 500 MG tablet Take 1 tablet (500 mg total) by mouth 2 (two) times daily. 09/07/22  Yes Dain Laseter K, PA-C  FLUoxetine (PROZAC) 20 MG capsule TAKE 1 CAPSULE BY MOUTH EVERY DAY 05/14/22   Allwardt, Alyssa M, PA-C  ibuprofen (ADVIL) 600 MG tablet Take 1 tablet (600 mg total) by mouth every 6 (six) hours as needed. 01/09/22   Peter Garter, PA    Family History Family History  Problem Relation Age of Onset   Diabetes Mother    Arthritis Mother    Other Mother        auto immune disease   Depression Mother    Kidney disease Mother    Cancer Maternal Grandmother  cervical   Depression Maternal Grandmother    Cancer Maternal Grandfather        lung that went to brain   Other Paternal Grandfather        has a pacemaker    Social History Social History   Tobacco Use   Smoking status: Never   Smokeless tobacco: Never  Vaping Use   Vaping status: Some Days   Start date: 05/13/2014   Substances: Flavoring  Substance Use Topics   Alcohol use: Yes    Comment: Occas   Drug use: No     Allergies   Patient has no known allergies.   Review of Systems Review of Systems  Constitutional:  Positive for activity change. Negative for appetite change, fatigue and fever.  Gastrointestinal:  Negative for abdominal pain, diarrhea, nausea and vomiting.  Genitourinary:  Positive for dysuria, frequency, urgency and vaginal discharge. Negative for flank pain,  hematuria, pelvic pain, vaginal bleeding and vaginal pain.     Physical Exam Triage Vital Signs ED Triage Vitals  Encounter Vitals Group     BP 09/07/22 1231 (!) 140/86     Systolic BP Percentile --      Diastolic BP Percentile --      Pulse Rate 09/07/22 1231 98     Resp 09/07/22 1231 18     Temp 09/07/22 1231 99 F (37.2 C)     Temp Source 09/07/22 1231 Oral     SpO2 09/07/22 1231 98 %     Weight --      Height --      Head Circumference --      Peak Flow --      Pain Score 09/07/22 1234 7     Pain Loc --      Pain Education --      Exclude from Growth Chart --    No data found.  Updated Vital Signs BP (!) 140/86 (BP Location: Right Arm)   Pulse 98   Temp 99 F (37.2 C) (Oral)   Resp 18   LMP 08/15/2022 (Approximate)   SpO2 98%   Visual Acuity Right Eye Distance:   Left Eye Distance:   Bilateral Distance:    Right Eye Near:   Left Eye Near:    Bilateral Near:     Physical Exam Vitals reviewed. Exam conducted with a chaperone present.  Constitutional:      General: She is awake. She is not in acute distress.    Appearance: Normal appearance. She is well-developed. She is not ill-appearing.     Comments: Very pleasant female appears stated age in no acute distress sitting comfortably in exam room  HENT:     Head: Normocephalic and atraumatic.  Cardiovascular:     Rate and Rhythm: Normal rate and regular rhythm.     Heart sounds: Normal heart sounds, S1 normal and S2 normal. No murmur heard. Pulmonary:     Effort: Pulmonary effort is normal.     Breath sounds: Normal breath sounds. No wheezing, rhonchi or rales.     Comments: Clear to auscultation bilaterally Abdominal:     General: Bowel sounds are normal.     Palpations: Abdomen is soft.     Tenderness: There is abdominal tenderness in the suprapubic area. There is no right CVA tenderness, left CVA tenderness, guarding or rebound.     Comments: Mild tenderness palpation in suprapubic region.  No  evidence of acute abdomen on physical exam.  Genitourinary:  Labia:        Right: No rash or tenderness.        Left: No rash or tenderness.      Vagina: Vaginal discharge and erythema present.     Cervix: Normal.     Uterus: Normal.      Adnexa: Right adnexa normal and left adnexa normal.     Comments: Melton Alar, CMA present as chaperone during exam.  Thin white discharge noted posterior vaginal vault.  Erythema and swelling noted vaginal wall.  No CMT or adnexal tenderness on bimanual exam. Psychiatric:        Behavior: Behavior is cooperative.      UC Treatments / Results  Labs (all labs ordered are listed, but only abnormal results are displayed) Labs Reviewed  POCT URINALYSIS DIP (MANUAL ENTRY) - Abnormal; Notable for the following components:      Result Value   Bilirubin, UA small (*)    Spec Grav, UA >=1.030 (*)    Blood, UA moderate (*)    Protein Ur, POC trace (*)    Urobilinogen, UA 2.0 (*)    Leukocytes, UA Trace (*)    All other components within normal limits  URINE CULTURE  POCT URINE PREGNANCY  CERVICOVAGINAL ANCILLARY ONLY    EKG   Radiology No results found.  Procedures Procedures (including critical care time)  Medications Ordered in UC Medications - No data to display  Initial Impression / Assessment and Plan / UC Course  I have reviewed the triage vital signs and the nursing notes.  Pertinent labs & imaging results that were available during my care of the patient were reviewed by me and considered in my medical decision making (see chart for details).     Patient is well-appearing, afebrile, nontoxic, nontachycardic.  UA did have trace white blood cells but suspect this is more related to her vaginal infection given physical exam findings.  Will treat for acute vaginitis with metronidazole twice daily for 7 days.  We discussed that she should not drink any alcohol on this medication for 3 days after completing course.  She was also given 2  doses of Diflucan with instruction to take 1 today and a second in 1 week.  She is to wear loosefitting cotton underwear and use hypoallergenic soaps and detergents.  STI swab was collected and is pending.  We will contact her if we need to arrange additional treatment.  Will send her urine for culture but defer antibiotics until results are available.  Recommend that she rest and drink plenty of fluid.  Discussed that if anything changes or worsens and she has pelvic pain, abdominal pain, fever, nausea, vomiting she needs to be seen emergently.  Strict return precautions given.  Final Clinical Impressions(s) / UC Diagnoses   Final diagnoses:  Dysuria  Acute vaginitis  Vaginal discharge     Discharge Instructions      Your urine had some white blood cells but I believe this is more because of a vaginal infection.  We will send this for culture and contact you if we need to start additional antibiotics.  We are treating for a vaginal infection.  Please start metronidazole twice daily for 7 days.  Do not drink any alcohol while on this medication and for 3 days after completing course.  Take Diflucan today and a second dose in 1 week.  Wear loosefitting cotton underwear and use hypotonic soaps and detergents.  We will contact you if need to arrange additional  treatment.  If anything worsens or changes and you have severe pain, fever, nausea, vomiting you need to be seen immediately.     ED Prescriptions     Medication Sig Dispense Auth. Provider   metroNIDAZOLE (FLAGYL) 500 MG tablet Take 1 tablet (500 mg total) by mouth 2 (two) times daily. 14 tablet Nanda Bittick K, PA-C   fluconazole (DIFLUCAN) 150 MG tablet Take 1 tablet (150 mg total) by mouth once a week for 2 doses. 2 tablet Charmika Macdonnell, Noberto Retort, PA-C      PDMP not reviewed this encounter.   Jeani Hawking, PA-C 09/07/22 1306

## 2022-09-07 NOTE — ED Triage Notes (Signed)
Pt reports she has frequent urination, burning with urination, painful urination, hurts to sit dow, and low abdominal pain x 2 days    Reports unprotected sexual encounter x 1 week

## 2022-09-09 LAB — URINE CULTURE: Culture: NO GROWTH

## 2022-09-10 ENCOUNTER — Encounter: Payer: Self-pay | Admitting: Obstetrics & Gynecology

## 2022-09-10 ENCOUNTER — Other Ambulatory Visit (HOSPITAL_COMMUNITY)
Admission: RE | Admit: 2022-09-10 | Discharge: 2022-09-10 | Disposition: A | Discharge status: Home / Self Care | Payer: Federal, State, Local not specified - PPO | Source: Ambulatory Visit | Attending: Obstetrics & Gynecology | Admitting: Obstetrics & Gynecology

## 2022-09-10 ENCOUNTER — Other Ambulatory Visit (INDEPENDENT_AMBULATORY_CARE_PROVIDER_SITE_OTHER): Payer: Federal, State, Local not specified - PPO | Admitting: Obstetrics & Gynecology

## 2022-09-10 VITALS — BP 143/85 | HR 102 | Ht 65.0 in | Wt 247.5 lb

## 2022-09-10 DIAGNOSIS — B009 Herpesviral infection, unspecified: Secondary | ICD-10-CM

## 2022-09-10 DIAGNOSIS — R030 Elevated blood-pressure reading, without diagnosis of hypertension: Secondary | ICD-10-CM | POA: Diagnosis not present

## 2022-09-10 DIAGNOSIS — Z124 Encounter for screening for malignant neoplasm of cervix: Secondary | ICD-10-CM

## 2022-09-10 DIAGNOSIS — Z113 Encounter for screening for infections with a predominantly sexual mode of transmission: Secondary | ICD-10-CM

## 2022-09-10 DIAGNOSIS — Z3009 Encounter for other general counseling and advice on contraception: Secondary | ICD-10-CM

## 2022-09-10 LAB — CERVICOVAGINAL ANCILLARY ONLY
Bacterial Vaginitis (gardnerella): NEGATIVE
Candida Glabrata: NEGATIVE
Candida Vaginitis: NEGATIVE
Chlamydia: NEGATIVE
Comment: NEGATIVE
Comment: NEGATIVE
Comment: NEGATIVE
Comment: NEGATIVE
Comment: NEGATIVE
Comment: NORMAL
Neisseria Gonorrhea: NEGATIVE
Trichomonas: NEGATIVE

## 2022-09-10 MED ORDER — VALACYCLOVIR HCL 500 MG PO TABS
1000.0000 mg | ORAL_TABLET | Freq: Two times a day (BID) | ORAL | 0 refills | Status: AC
Start: 2022-09-10 — End: 2022-09-17

## 2022-09-10 MED ORDER — LIDOCAINE 5 % EX OINT
TOPICAL_OINTMENT | CUTANEOUS | 0 refills | Status: DC
Start: 2022-09-10 — End: 2022-10-10

## 2022-09-10 NOTE — Progress Notes (Signed)
GYN VISIT Patient name: Rebecca Gibbs MRN [id number]  Date of birth: 10-26-1995 Chief Complaint:   Pelvic Pain (spotting)  History of Present Illness:   Rebecca Gibbs is a 27 y.o. [phone] female being seen today for the following concerns:  ER follow up: She notes she has burning with the urine touches her labial.  Notes vaginal discharge- mostly yellow to blood-tinged.  Denies vaginal itching or odor.  Notes occasional episodes of feeling feverish.  Some nausea.  New partner.      Previously on nuvaring for contraception and wishes to return.  Currently using pull out method only.  Patient's last menstrual period was 08/15/2022 (approximate).    Review of Systems:   Pertinent items are noted in HPI Denies fever/chills, dizziness, headaches, visual disturbances, fatigue, shortness of breath, chest pain, abdominal pain, vomiting, no problems with periods, bowel movements, urination, or intercourse unless otherwise stated above.  Pertinent History Reviewed:   Past Surgical History:  Procedure Laterality Date   CHOLECYSTECTOMY N/A 05/19/2018   Procedure: LAPAROSCOPIC CHOLECYSTECTOMY;  Surgeon: [name], MD;  Location: [facility];  Service: General;  Laterality: N/A;   DILATION AND CURETTAGE OF UTERUS N/A 04/30/2019   Procedure: SUCTION DILATATION AND CURETTAGE;  Surgeon: Lazaro Arms, MD;  Location: [facility];  Service: Gynecology;  Laterality: N/A;   TONSILLECTOMY      Past Medical History:  Diagnosis Date   Diabetes mellitus without complication (HCC)    Gestational DM, on Glyburide   Pregnant 01/07/2014   Raynaud's disease    Reviewed problem list, medications and allergies. Physical Assessment:   Vitals:   09/10/22 1139 09/10/22 1150  BP: (!) 143/97 (!) 143/85  Pulse: (!) 102   Weight: 247 lb 8 oz (112.3 kg)   Height: 5\' 5"  (1.651 m)   Body mass index is 41.19 kg/m.       Physical Examination:   General appearance: alert, well appearing, and in no  distress  Psych: mood appropriate, normal affect  Skin: warm & dry   Cardiovascular: normal heart rate noted  Respiratory: normal respiratory effort, no distress  Abdomen: soft, non-tender, no rebound, no guarding  Pelvic: VULVA: normal appearing labia majora, labia minor with scaling with moderate clear frothy discharge noted.  Inner labia minora with 2 ulcerative lesions noted VAGINA: frothy white to yellow dishcarge noted, CERVIX: friable excoriated appearance with multiple ulcerative lesions  Extremities: no edema   Chaperone: Faith Rogue    Assessment & Plan:  1) HSV2 -discussed findings on exam -pt to discuss with partner these findings -Rx sent in for treatment and for pain management -discussed pelvic rest -plan to f/u in 2-3 wks to review results and future management  2) Contraceptive management, Elevated Blood pressure -will likely plan to restart nuvaring, however elevated BP noted today -suspect BP elevation due to anxiety, will recheck at next visit -if within normal range will restart ring  3) Preventive screening -pap collected, reviewed guidelines -complete STI screening to be completed  Meds ordered this encounter  Medications   valACYclovir (VALTREX) 500 MG tablet    Sig: Take 2 tablets (1,000 mg total) by mouth 2 (two) times daily for 7 days.    Dispense:  14 tablet    Refill:  0   lidocaine (XYLOCAINE) 5 % ointment    Sig: Apply small amount externally as needed    Dispense:  30 g    Refill:  0      Orders Placed This  Encounter  Procedures   Herpes simplex virus culture   RPR   HIV Antibody (routine testing w rflx)    Return for next available with me- hopefully around Oct 1.   Myna Hidalgo, DO Attending Obstetrician & Gynecologist, [facility] for [employer], [facility]

## 2022-09-13 LAB — HERPES SIMPLEX VIRUS CULTURE

## 2022-09-14 LAB — CYTOLOGY - PAP
Comment: NEGATIVE
Diagnosis: NEGATIVE
Diagnosis: REACTIVE
High risk HPV: NEGATIVE

## 2022-10-03 ENCOUNTER — Ambulatory Visit: Payer: Federal, State, Local not specified - PPO | Admitting: Obstetrics & Gynecology

## 2022-10-08 ENCOUNTER — Ambulatory Visit: Payer: Federal, State, Local not specified - PPO | Admitting: Obstetrics & Gynecology

## 2022-10-10 ENCOUNTER — Encounter: Payer: Self-pay | Admitting: Obstetrics & Gynecology

## 2022-10-10 ENCOUNTER — Telehealth: Payer: Federal, State, Local not specified - PPO | Admitting: Obstetrics & Gynecology

## 2022-10-10 VITALS — BP 124/76

## 2022-10-10 DIAGNOSIS — B009 Herpesviral infection, unspecified: Secondary | ICD-10-CM | POA: Diagnosis not present

## 2022-10-10 DIAGNOSIS — Z30015 Encounter for initial prescription of vaginal ring hormonal contraceptive: Secondary | ICD-10-CM | POA: Diagnosis not present

## 2022-10-10 MED ORDER — ETONOGESTREL-ETHINYL ESTRADIOL 0.12-0.015 MG/24HR VA RING
VAGINAL_RING | VAGINAL | 3 refills | Status: DC
Start: 2022-10-10 — End: 2023-04-29

## 2022-10-10 MED ORDER — VALACYCLOVIR HCL 500 MG PO TABS
500.0000 mg | ORAL_TABLET | Freq: Every day | ORAL | 3 refills | Status: AC
Start: 2022-10-10 — End: 2023-01-08

## 2022-10-10 NOTE — Progress Notes (Signed)
TELEHEALTH GYNECOLOGY VISIT ENCOUNTER NOTE  Provider location: [facility]   Patient location: Home  I connected with Rebecca Gibbs on 10/10/22 at  2:50 PM EDT by telephone and verified that I am speaking with the correct person using two identifiers. Patient was unable to do MyChart audiovisual encounter due to technical difficulties, she tried several times.    I discussed the limitations, risks, security and privacy concerns of performing an evaluation and management service by telephone and the availability of in person appointments. I also discussed with the patient that there may be a patient responsible charge related to this service. The patient expressed understanding and agreed to proceed.   History:  Rebecca Gibbs is a 27 y.o. [phone] female being evaluated today for contraceptive management/HSV follow up.   HSV2: Recent primary diagnosis.  Doing better now, symptoms have resolved.  Pt had some questions regarding transmission, suppression, etc.  Contraceptive management as well as heavy painful periods.  Last visit discussed restarting ring.  BP needed to be rechecked.  Pt reports normal BP at home.  Discussed when combined medication is contraindicated.     Past Medical History:  Diagnosis Date   Diabetes mellitus without complication (HCC)    Gestational DM, on Glyburide   Pregnant 01/07/2014   Raynaud's disease    Past Surgical History:  Procedure Laterality Date   CHOLECYSTECTOMY N/A 05/19/2018   Procedure: LAPAROSCOPIC CHOLECYSTECTOMY;  Surgeon: [name], MD;  Location: [facility];  Service: General;  Laterality: N/A;   DILATION AND CURETTAGE OF UTERUS N/A 04/30/2019   Procedure: SUCTION DILATATION AND CURETTAGE;  Surgeon: Lazaro Arms, MD;  Location: [facility];  Service: Gynecology;  Laterality: N/A;   TONSILLECTOMY     The following portions of the patient's history were reviewed and updated as appropriate: allergies, current  medications, past family history, past medical history, past social history, past surgical history and problem list.   Health Maintenance:  Normal pap and negative HRHPV on 09/2022.   Review of Systems:  Pertinent items noted in HPI and remainder of comprehensive ROS otherwise negative.  Physical Exam:  BP 124/76    General:  Alert, oriented and cooperative.   Mental Status: Normal mood and affect perceived. Normal judgment and thought content.  Physical exam deferred due to nature of the encounter  Labs and Imaging No results found for this or any previous visit (from the past 336 hour(s)). No results found.    Assessment and Plan:  -Contraceptive management Pt notes normal BP at home, plan to restart ring F/U in 3-4 mos  -HSV2 Plan for suppression therapy Question/concerns were addressed  Meds ordered this encounter  Medications   etonogestrel-ethinyl estradiol (NUVARING) 0.12-0.015 MG/24HR vaginal ring    Sig: Insert vaginally and leave in place for 3 consecutive weeks, then remove for 1 week.    Dispense:  3 each    Refill:  3   valACYclovir (VALTREX) 500 MG tablet    Sig: Take 1 tablet (500 mg total) by mouth daily.    Dispense:  30 tablet    Refill:  3      I discussed the assessment and treatment plan with the patient. The patient was provided an opportunity to ask questions and all were answered. The patient agreed with the plan and demonstrated an understanding of the instructions.   I provided 20 minutes of non-face-to-face time during this encounter, which including reviewing the chart, talking to  patient and documentation.   Sharon Seller, DO [facility] [employer]

## 2022-11-21 ENCOUNTER — Ambulatory Visit: Payer: Self-pay

## 2022-11-21 ENCOUNTER — Ambulatory Visit
Admission: EM | Admit: 2022-11-21 | Discharge: 2022-11-21 | Disposition: A | Discharge status: Home / Self Care | Payer: Federal, State, Local not specified - PPO | Attending: Family Medicine | Admitting: Family Medicine

## 2022-11-21 DIAGNOSIS — J22 Unspecified acute lower respiratory infection: Secondary | ICD-10-CM

## 2022-11-21 DIAGNOSIS — R509 Fever, unspecified: Secondary | ICD-10-CM

## 2022-11-21 DIAGNOSIS — J189 Pneumonia, unspecified organism: Secondary | ICD-10-CM

## 2022-11-21 DIAGNOSIS — R000 Tachycardia, unspecified: Secondary | ICD-10-CM

## 2022-11-21 LAB — POC COVID19/FLU A&B COMBO
Covid Antigen, POC: NEGATIVE
Influenza A Antigen, POC: NEGATIVE
Influenza B Antigen, POC: NEGATIVE

## 2022-11-21 LAB — POCT URINE PREGNANCY: Preg Test, Ur: NEGATIVE

## 2022-11-21 MED ORDER — DEXAMETHASONE SODIUM PHOSPHATE 10 MG/ML IJ SOLN
10.0000 mg | Freq: Once | INTRAMUSCULAR | Status: AC
  Administered 2022-11-21: 10 mg via INTRAMUSCULAR

## 2022-11-21 MED ORDER — IBUPROFEN 800 MG PO TABS
800.0000 mg | ORAL_TABLET | Freq: Once | ORAL | Status: AC
  Administered 2022-11-21: 800 mg via ORAL

## 2022-11-21 MED ORDER — AMOXICILLIN-POT CLAVULANATE 875-125 MG PO TABS: 1.0000 | ORAL_TABLET | Freq: Two times a day (BID) | ORAL | 0 refills | Status: DC

## 2022-11-21 MED ORDER — ACETAMINOPHEN 500 MG PO TABS
1000.0000 mg | ORAL_TABLET | Freq: Once | ORAL | Status: AC
  Administered 2022-11-21: 1000 mg via ORAL

## 2022-11-21 MED ORDER — PROMETHAZINE-DM 6.25-15 MG/5ML PO SYRP: 5.0000 mL | ORAL_SOLUTION | Freq: Four times a day (QID) | ORAL | 0 refills | Status: DC | PRN

## 2022-11-21 NOTE — ED Provider Notes (Signed)
[facility]    CSN: [id number] Arrival date & time: 11/21/22  3557      History   Chief Complaint No chief complaint on file.   HPI Rebecca Gibbs is a 27 y.o. female.   Presenting today with 5-day history of progressively worsening nausea, vomiting, cough, congestion, chills, sweats, fever, chest heaviness, pain with coughing.  Denies dizziness, palpitations, diarrhea, severe abdominal pain.  So far trying over-the-counter remedies with minimal relief.  No known history of chronic pulmonary disease.    Past Medical History:  Diagnosis Date   Diabetes mellitus without complication (HCC)    Gestational DM, on Glyburide   Pregnant 01/07/2014   Raynaud's disease     Patient Active Problem List   Diagnosis Date Noted   Calculus of gallbladder without cholecystitis without obstruction 05/08/2018   Post partum depression 07/28/2015   Term pregnancy 08/31/2014   Gestational diabetes mellitus, class A2 07/15/2014   Pregnant 01/07/2014    Past Surgical History:  Procedure Laterality Date   CHOLECYSTECTOMY N/A 05/19/2018   Procedure: LAPAROSCOPIC CHOLECYSTECTOMY;  Surgeon: [name], MD;  Location: [facility];  Service: General;  Laterality: N/A;   DILATION AND CURETTAGE OF UTERUS N/A 04/30/2019   Procedure: SUCTION DILATATION AND CURETTAGE;  Surgeon: Lazaro Arms, MD;  Location: [facility];  Service: Gynecology;  Laterality: N/A;   TONSILLECTOMY      OB History     Gravida  3   Para  1   Term  1   Preterm      AB  2   Living  1      SAB  1   IAB      Ectopic      Multiple  0   Live Births  1            Home Medications    Prior to Admission medications   Medication Sig Start Date End Date Taking? Authorizing Provider  amoxicillin-clavulanate (AUGMENTIN) 875-125 MG tablet Take 1 tablet by mouth every 12 (twelve) hours. 11/21/22  Yes Particia Nearing, PA-C  promethazine-dextromethorphan (PROMETHAZINE-DM) 6.25-15 MG/5ML syrup  Take 5 mLs by mouth 4 (four) times daily as needed. 11/21/22  Yes Particia Nearing, PA-C  etonogestrel-ethinyl estradiol (NUVARING) 0.12-0.015 MG/24HR vaginal ring Insert vaginally and leave in place for 3 consecutive weeks, then remove for 1 week. 10/10/22   Myna Hidalgo, DO  FLUoxetine (PROZAC) 20 MG capsule TAKE 1 CAPSULE BY MOUTH EVERY DAY 05/14/22   Allwardt, Alyssa M, PA-C  ibuprofen (ADVIL) 600 MG tablet Take 1 tablet (600 mg total) by mouth every 6 (six) hours as needed. 01/09/22   Peter Garter, PA  valACYclovir (VALTREX) 500 MG tablet Take 1 tablet (500 mg total) by mouth daily. 10/10/22 01/08/23  Myna Hidalgo, DO    Family History Family History  Problem Relation Age of Onset   Diabetes Mother    Arthritis Mother    Other Mother        auto immune disease   Depression Mother    Kidney disease Mother    Cancer Maternal Grandmother        cervical   Depression Maternal Grandmother    Cancer Maternal Grandfather        lung that went to brain   Other Paternal Grandfather        has a pacemaker    Social History Social History   Tobacco Use   Smoking status: Never   Smokeless  tobacco: Never  Vaping Use   Vaping status: Some Days   Start date: 05/13/2014   Substances: Flavoring  Substance Use Topics   Alcohol use: Yes    Comment: Occas   Drug use: No     Allergies   Patient has no known allergies.   Review of Systems Review of Systems Per HPI  Physical Exam Triage Vital Signs ED Triage Vitals  Encounter Vitals Group     BP 11/21/22 1125 (!) 160/111     Systolic BP Percentile --      Diastolic BP Percentile --      Pulse Rate 11/21/22 1125 (!) 113     Resp 11/21/22 1125 19     Temp 11/21/22 1125 (!) 100.4 F (38 C)     Temp Source 11/21/22 1125 Oral     SpO2 11/21/22 1125 95 %     Weight --      Height --      Head Circumference --      Peak Flow --      Pain Score 11/21/22 1128 5     Pain Loc --      Pain Education --      Exclude from  Growth Chart --    No data found.  Updated Vital Signs BP (!) 160/111 (BP Location: Right Arm)   Pulse (!) 113   Temp (!) 100.4 F (38 C) (Oral)   Resp 19   LMP  (Exact Date)   SpO2 95%   Visual Acuity Right Eye Distance:   Left Eye Distance:   Bilateral Distance:    Right Eye Near:   Left Eye Near:    Bilateral Near:     Physical Exam Vitals and nursing note reviewed.  Constitutional:      Appearance: Normal appearance.  HENT:     Head: Atraumatic.     Right Ear: Tympanic membrane and external ear normal.     Left Ear: Tympanic membrane and external ear normal.     Nose: Congestion present.     Mouth/Throat:     Mouth: Mucous membranes are moist.     Pharynx: Posterior oropharyngeal erythema present.  Eyes:     Extraocular Movements: Extraocular movements intact.     Conjunctiva/sclera: Conjunctivae normal.  Cardiovascular:     Rate and Rhythm: Normal rate and regular rhythm.     Heart sounds: Normal heart sounds.  Pulmonary:     Effort: Pulmonary effort is normal.     Breath sounds: Rales present.  Musculoskeletal:        General: Normal range of motion.     Cervical back: Normal range of motion and neck supple.  Skin:    General: Skin is warm and dry.  Neurological:     Mental Status: She is alert and oriented to person, place, and time.  Psychiatric:        Mood and Affect: Mood normal.        Thought Content: Thought content normal.      UC Treatments / Results  Labs (all labs ordered are listed, but only abnormal results are displayed) Labs Reviewed  POC COVID19/FLU A&B COMBO  POCT URINE PREGNANCY    EKG   Radiology DG Chest 2 View  Result Date: 11/21/2022 CLINICAL DATA:  Productive cough and fever. EXAM: CHEST - 2 VIEW COMPARISON:  01/09/2022 and CT chest 01/09/2022. FINDINGS: Trachea is midline. Heart size normal. Right lower lobe airspace consolidation. Lungs are otherwise clear. No  pleural fluid. IMPRESSION: Right lower lobe pneumonia.  Electronically Signed   By: Leanna Battles M.D.   On: 11/21/2022 15:46    Procedures Procedures (including critical care time)  Medications Ordered in UC Medications  ibuprofen (ADVIL) tablet 800 mg (800 mg Oral Given 11/21/22 1221)  acetaminophen (TYLENOL) tablet 1,000 mg (1,000 mg Oral Given 11/21/22 1221)  dexamethasone (DECADRON) injection 10 mg (10 mg Intramuscular Given 11/21/22 1221)    Initial Impression / Assessment and Plan / UC Course  I have reviewed the triage vital signs and the nursing notes.  Pertinent labs & imaging results that were available during my care of the patient were reviewed by me and considered in my medical decision making (see chart for details).     Febrile, tachycardic and hypertensive in triage, chest x-ray today showing a right lower lobe pneumonia.  Treat with Augmentin, Phenergan DM, IM Decadron.  Ibuprofen and Tylenol given for fever in triage.  Return for any worsening symptoms.  Final Clinical Impressions(s) / UC Diagnoses   Final diagnoses:  Fever, unspecified  Tachycardia  Pneumonia of right lower lobe due to infectious organism   Discharge Instructions   None    ED Prescriptions     Medication Sig Dispense Auth. Provider   amoxicillin-clavulanate (AUGMENTIN) 875-125 MG tablet Take 1 tablet by mouth every 12 (twelve) hours. 14 tablet Particia Nearing, [place]   promethazine-dextromethorphan (PROMETHAZINE-DM) 6.25-15 MG/5ML syrup Take 5 mLs by mouth 4 (four) times daily as needed. 100 mL Particia Nearing, [place]      PDMP not reviewed this encounter.   Particia Nearing, [place] 11/21/22 Rickey Primus

## 2022-11-21 NOTE — ED Triage Notes (Addendum)
Pt reports nausea, vomiting, cough congestion chills sweats fever. Onset Saturday, started off like a sinus infection but has progressively gotten worse. Chest heaviness.

## 2022-11-23 ENCOUNTER — Ambulatory Visit
Admission: EM | Admit: 2022-11-23 | Discharge: 2022-11-23 | Disposition: A | Discharge status: Home / Self Care | Payer: Self-pay | Attending: Nurse Practitioner | Admitting: Nurse Practitioner

## 2022-11-23 ENCOUNTER — Emergency Department (HOSPITAL_COMMUNITY): Payer: Self-pay

## 2022-11-23 ENCOUNTER — Encounter (HOSPITAL_COMMUNITY): Payer: Self-pay

## 2022-11-23 ENCOUNTER — Other Ambulatory Visit: Payer: Self-pay

## 2022-11-23 ENCOUNTER — Emergency Department (HOSPITAL_COMMUNITY)
Admission: EM | Admit: 2022-11-23 | Discharge: 2022-11-23 | Discharge status: Against Medical Advice | Payer: Self-pay | Attending: Emergency Medicine | Admitting: Emergency Medicine

## 2022-11-23 DIAGNOSIS — Z5321 Procedure and treatment not carried out due to patient leaving prior to being seen by health care provider: Secondary | ICD-10-CM | POA: Insufficient documentation

## 2022-11-23 DIAGNOSIS — R0602 Shortness of breath: Secondary | ICD-10-CM | POA: Insufficient documentation

## 2022-11-23 DIAGNOSIS — J189 Pneumonia, unspecified organism: Secondary | ICD-10-CM

## 2022-11-23 DIAGNOSIS — R059 Cough, unspecified: Secondary | ICD-10-CM | POA: Insufficient documentation

## 2022-11-23 MED ORDER — ALBUTEROL SULFATE (2.5 MG/3ML) 0.083% IN NEBU
2.5000 mg | INHALATION_SOLUTION | Freq: Once | RESPIRATORY_TRACT | Status: AC
  Administered 2022-11-23: 2.5 mg via RESPIRATORY_TRACT

## 2022-11-23 MED ORDER — IBUPROFEN 800 MG PO TABS
800.0000 mg | ORAL_TABLET | Freq: Once | ORAL | Status: AC
  Administered 2022-11-23: 800 mg via ORAL

## 2022-11-23 NOTE — ED Triage Notes (Signed)
Seen at urgent care Wednesday Dx'ed with pneumonia  Recd steroid shot then   Pt went back to urgent care today, urgent care sent to ED  Pt complains of increased coughing and SOB

## 2022-11-23 NOTE — ED Triage Notes (Addendum)
Pt states she was seen here two days ago and was diagnosed with pneumonia. States she has been taking all  meds as prescribed and is not feeling any better. States last night she had the chills and is still coughing. Pt also SOB and C/O generalized weakness.

## 2022-11-23 NOTE — ED Notes (Signed)
Called pt x1 for transfer to [facility], pt not in lobby at this time.

## 2022-11-23 NOTE — ED Notes (Signed)
Final call Pt not in lobby ELOPED

## 2022-11-23 NOTE — ED Notes (Signed)
Vitals reassessed Pt maintained 95% while talking Pt appears in NAD

## 2022-11-23 NOTE — ED Provider Notes (Signed)
[facility]    CSN: [id number] Arrival date & time: 11/23/22  1530      History   Chief Complaint Chief Complaint  Patient presents with   Cough    HPI Rebecca Gibbs is a 27 y.o. female.   The history is provided by the patient.   Patient presents for follow-up for right lower lobe pneumonia.  Patient was seen in this clinic on 11/20.  She was prescribed Augmentin and Promethazine DM.  She was also given a corticosteroid injection in the clinic.  Patient states initially, she began to feel somewhat better.  She states over the past 24 hours, she has began to worsen.  She endorses continued fever, worsening shortness of breath, difficulty breathing, chest discomfort, nausea and vomiting.  Patient also endorses wheezing.  Patient states that she has been take Tylenol consistently.  She states that she begins to become short of breath with exertion.  She continues to complain of chest heaviness, and pain with coughing.  Patient denies history of prior lung disease, she does have a history of diabetes.  Past Medical History:  Diagnosis Date   Diabetes mellitus without complication (HCC)    Gestational DM, on Glyburide   Pregnant 01/07/2014   Raynaud's disease     Patient Active Problem List   Diagnosis Date Noted   Calculus of gallbladder without cholecystitis without obstruction 05/08/2018   Post partum depression 07/28/2015   Term pregnancy 08/31/2014   Gestational diabetes mellitus, class A2 07/15/2014   Pregnant 01/07/2014    Past Surgical History:  Procedure Laterality Date   CHOLECYSTECTOMY N/A 05/19/2018   Procedure: LAPAROSCOPIC CHOLECYSTECTOMY;  Surgeon: [name], MD;  Location: [facility];  Service: General;  Laterality: N/A;   DILATION AND CURETTAGE OF UTERUS N/A 04/30/2019   Procedure: SUCTION DILATATION AND CURETTAGE;  Surgeon: Lazaro Arms, MD;  Location: [facility];  Service: Gynecology;  Laterality: N/A;   TONSILLECTOMY      OB History      Gravida  3   Para  1   Term  1   Preterm      AB  2   Living  1      SAB  1   IAB      Ectopic      Multiple  0   Live Births  1            Home Medications    Prior to Admission medications   Medication Sig Start Date End Date Taking? Authorizing Provider  amoxicillin-clavulanate (AUGMENTIN) 875-125 MG tablet Take 1 tablet by mouth every 12 (twelve) hours. 11/21/22   Particia Nearing, PA-C  etonogestrel-ethinyl estradiol (NUVARING) 0.12-0.015 MG/24HR vaginal ring Insert vaginally and leave in place for 3 consecutive weeks, then remove for 1 week. 10/10/22   Myna Hidalgo, DO  FLUoxetine (PROZAC) 20 MG capsule TAKE 1 CAPSULE BY MOUTH EVERY DAY 05/14/22   Allwardt, Alyssa M, PA-C  ibuprofen (ADVIL) 600 MG tablet Take 1 tablet (600 mg total) by mouth every 6 (six) hours as needed. 01/09/22   Peter Garter, PA  promethazine-dextromethorphan (PROMETHAZINE-DM) 6.25-15 MG/5ML syrup Take 5 mLs by mouth 4 (four) times daily as needed. 11/21/22   Particia Nearing, PA-C  valACYclovir (VALTREX) 500 MG tablet Take 1 tablet (500 mg total) by mouth daily. 10/10/22 01/08/23  Myna Hidalgo, DO    Family History Family History  Problem Relation Age of Onset   Diabetes Mother  Arthritis Mother    Other Mother        auto immune disease   Depression Mother    Kidney disease Mother    Cancer Maternal Grandmother        cervical   Depression Maternal Grandmother    Cancer Maternal Grandfather        lung that went to brain   Other Paternal Grandfather        has a pacemaker    Social History Social History   Tobacco Use   Smoking status: Never   Smokeless tobacco: Never  Vaping Use   Vaping status: Some Days   Start date: 05/13/2014   Substances: Flavoring  Substance Use Topics   Alcohol use: Yes    Comment: Occas   Drug use: No     Allergies   Patient has no known allergies.   Review of Systems Review of Systems Per HPI  Physical  Exam Triage Vital Signs ED Triage Vitals  Encounter Vitals Group     BP 11/23/22 1540 124/87     Systolic BP Percentile --      Diastolic BP Percentile --      Pulse Rate 11/23/22 1540 (!) 125     Resp 11/23/22 1540 (!) 22     Temp 11/23/22 1540 (!) 100.9 F (38.3 C)     Temp Source 11/23/22 1540 Oral     SpO2 11/23/22 1540 93 %     Weight --      Height --      Head Circumference --      Peak Flow --      Pain Score 11/23/22 1541 9     Pain Loc --      Pain Education --      Exclude from Growth Chart --    No data found.  Updated Vital Signs BP 124/87 (BP Location: Right Arm)   Pulse (!) 125   Temp (!) 100.9 F (38.3 C) (Oral)   Resp (!) 22   SpO2 93%   Visual Acuity Right Eye Distance:   Left Eye Distance:   Bilateral Distance:    Right Eye Near:   Left Eye Near:    Bilateral Near:     Physical Exam Vitals and nursing note reviewed.  Constitutional:      Appearance: Normal appearance. She is ill-appearing.  HENT:     Head: Normocephalic.     Right Ear: Tympanic membrane, ear canal and external ear normal.     Left Ear: Tympanic membrane, ear canal and external ear normal.     Mouth/Throat:     Mouth: Mucous membranes are moist.  Eyes:     Extraocular Movements: Extraocular movements intact.     Conjunctiva/sclera: Conjunctivae normal.     Pupils: Pupils are equal, round, and reactive to light.  Cardiovascular:     Rate and Rhythm: Normal rate and regular rhythm.     Pulses: Normal pulses.     Heart sounds: Normal heart sounds.  Pulmonary:     Effort: Pulmonary effort is normal.     Breath sounds: Rales (RLL) present. No wheezing.     Comments: Tachypneic  Albuterol nebulizer administered for shortness of breath.  Trial ambulation was performed after albuterol nebulizer.  Patient with increased shortness of breath, sats remained between 92 to 95%. Abdominal:     General: Bowel sounds are normal.     Palpations: Abdomen is soft.     Tenderness:  There  is no abdominal tenderness.  Musculoskeletal:     Cervical back: Normal range of motion.  Skin:    General: Skin is warm and dry.  Neurological:     General: No focal deficit present.     Mental Status: She is alert and oriented to person, place, and time.  Psychiatric:        Mood and Affect: Mood normal.        Behavior: Behavior normal.      UC Treatments / Results  Labs (all labs ordered are listed, but only abnormal results are displayed) Labs Reviewed - No data to display  EKG   Radiology No results found.  Procedures Procedures (including critical care time)  Medications Ordered in UC Medications - No data to display  Initial Impression / Assessment and Plan / UC Course  I have reviewed the triage vital signs and the nursing notes.  Pertinent labs & imaging results that were available during my care of the patient were reviewed by me and considered in my medical decision making (see chart for details).  Patient presents for concerns for worsening right lower lobe pneumonia.  Room air sats at 93%.  Patient presented to this clinic febrile and tachycardic.  Attempted albuterol nebulizer to see if this improves patient's persistent cough and O2 sats, trial ambulation was also performed, with minimal improvement of symptoms.  Given patient's persistent cough, and concern for worsening pneumonia, patient was advised it is recommended that she go to the emergency department for further evaluation.  Patient was in agreement with this plan of care and verbalized understanding.  Vital signs were mostly stable, patient is able to travel via private vehicle.  Patient discharged to the emergency department.  Final Clinical Impressions(s) / UC Diagnoses   Final diagnoses:  None   Discharge Instructions   None    ED Prescriptions   None    PDMP not reviewed this encounter.   Abran Cantor, NP 11/23/22 1730

## 2022-11-23 NOTE — ED Notes (Signed)
COVID and flu 11/21/22 negative

## 2022-11-23 NOTE — ED Notes (Addendum)
Walking challenge performed:  P readings: 130 (at rest) 137, 139, 145, 149  O2 readings: 97 (at rest) 94,95   Pt reports feeling "like she was swimming) while walking. Began to look dizzy.

## 2022-11-23 NOTE — ED Notes (Signed)
Called x2, not in the lobby at this time.

## 2022-11-23 NOTE — ED Notes (Signed)
Patient is being discharged from the Urgent Care and sent to the Emergency Department via POV . Per provider, patient is in need of higher level of care due to pneumonia. Patient is aware and verbalizes understanding of plan of care.  Vitals:   11/23/22 1540  BP: 124/87  Pulse: (!) 125  Resp: (!) 22  Temp: (!) 100.9 F (38.3 C)  SpO2: 93%

## 2022-11-23 NOTE — Discharge Instructions (Addendum)
Go to the emergency department for further evaluation

## 2023-04-17 ENCOUNTER — Other Ambulatory Visit: Payer: Self-pay | Admitting: *Deleted

## 2023-04-17 MED ORDER — VALACYCLOVIR HCL 500 MG PO TABS: 500.0000 mg | ORAL_TABLET | Freq: Every day | ORAL | 3 refills | Status: AC

## 2023-04-29 ENCOUNTER — Encounter: Payer: Self-pay | Admitting: Physician Assistant

## 2023-04-29 ENCOUNTER — Ambulatory Visit: Admitting: Physician Assistant

## 2023-04-29 ENCOUNTER — Ambulatory Visit (INDEPENDENT_AMBULATORY_CARE_PROVIDER_SITE_OTHER)

## 2023-04-29 VITALS — BP 122/84 | HR 87 | Temp 98.2°F | Ht 65.0 in | Wt 244.4 lb

## 2023-04-29 DIAGNOSIS — I998 Other disorder of circulatory system: Secondary | ICD-10-CM | POA: Diagnosis not present

## 2023-04-29 DIAGNOSIS — F419 Anxiety disorder, unspecified: Secondary | ICD-10-CM | POA: Diagnosis not present

## 2023-04-29 DIAGNOSIS — Z8701 Personal history of pneumonia (recurrent): Secondary | ICD-10-CM

## 2023-04-29 DIAGNOSIS — F32A Depression, unspecified: Secondary | ICD-10-CM

## 2023-04-29 MED ORDER — WEGOVY 0.25 MG/0.5ML ~~LOC~~ SOAJ
0.2500 mg | SUBCUTANEOUS | 0 refills | Status: DC
Start: 2023-04-29 — End: 2023-07-29

## 2023-04-29 MED ORDER — FLUOXETINE HCL 20 MG PO CAPS: 20.0000 mg | ORAL_CAPSULE | Freq: Every day | ORAL | 3 refills | Status: DC

## 2023-04-29 NOTE — Progress Notes (Signed)
 Patient ID: Rebecca Gibbs, female    DOB: 1995-08-06, 28 y.o.   MRN: [id number]   Assessment & Plan:  History of pneumonia -     DG Chest 2 View; Future  Anxiety and depression -     FLUoxetine  HCl; Take 1 capsule (20 mg total) by mouth daily.  Dispense: 90 capsule; Refill: 3  Obesity, Class III, BMI 40-49.9 (morbid obesity) (HCC) -     Wegovy ; Inject 0.25 mg into the skin once a week.  Dispense: 2 mL; Refill: 0    Assessment & Plan Hypertension Intermittent elevated blood pressure readings since September, with a recent reading of 200/111 per pt while donating plasma. Family history of hypertension on maternal side. Possible contributing factors include stress and improper cuff size. No current antihypertensive medication. Discussed lifestyle modifications, including reducing alcohol intake and increasing physical activity. - Check blood pressure before leaving the office. - Encourage reduction of alcohol intake and increase in physical activity. - Schedule follow-up in three months to reassess blood pressure and lifestyle changes.  Obesity Discussed weight management strategies, including lifestyle modifications and potential use of Wegovy . Previously discontinued Wegovy  due to cost but now has Medicaid coverage which may cover the medication. She tolerated the first month in the past. Encouraged to attempt lifestyle changes with Wegovy . BMI 40.67 today.  - Encourage lifestyle modifications including diet and exercise. - It is highly recommended to lose weight, Wegovy  therapy is recommended for patient in addition to lifestyle. Restart this treatment. Pt aware of risks vs benefits and possible adverse reactions  Depression Currently taking Prozac , with a history of non-compliance during a period of illness. Reports feeling well-managed on current dose. Discussed the importance of medication adherence. - Refill Prozac  prescription. - Encourage adherence to Prozac   regimen.  Pneumonia Pneumonia in November 2024 with prolonged recovery until February 2025. No follow-up chest X-ray performed post-recovery. Discussed the importance of ensuring complete resolution of pneumonia in adults. - Order chest X-ray to ensure resolution of pneumonia.      Return in about 3 months (around 07/29/2023) for physical, fasting labs .    Subjective:    Chief Complaint  Patient presents with   Hypertension    Pt in ofice for BP check and follow up for hypertension; pt has not been able to monitor at home; pt states no longer diabetic and showing in health maintenance pt wanting removed and corrected;     HPI Discussed the use of AI scribe software for clinical note transcription with the patient, who gave verbal consent to proceed.  History of Present Illness Rebecca Gibbs is a 28 year old female with hypertension who presents with elevated blood pressure readings.  She has experienced elevated blood pressure readings on several occasions since September. Initially noticed during a visit to her OB, she was advised to monitor it. In November, during an urgent care visit for pneumonia, her blood pressure was again elevated. She discontinued NuvaRing due to concerns about her blood pressure.  During plasma donations, her blood pressure readings have varied, with one instance reaching 200/111 mmHg, later rechecked at 130/80 mmHg. She has a family history of hypertension on her mother's side, contributing to her concern.  Recently, she experienced visual disturbances described as 'little floaties' while in the shower. These symptoms are not consistently associated with stress, as her blood pressure has been high even in non-stressful situations.  She is currently taking Prozac , which she resumed after a lapse during her  pneumonia illness, and reports feeling stable on her current dose. She has no current use of birth control as her partner has had a vasectomy.  Her  social history includes occasional alcohol use, no smoking or vaping, and limited physical activity. She has made dietary changes, reducing caffeine and avoiding fried foods due to gallbladder issues. 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     Past Medical History:  Diagnosis Date   History of gestational diabetes    Pregnant 01/07/2014   Raynaud's disease     Past Surgical History:  Procedure Laterality Date   CHOLECYSTECTOMY N/A 05/19/2018   Procedure: LAPAROSCOPIC CHOLECYSTECTOMY;  Surgeon: Awilda Bogus, MD;  Location: [facility];  Service: General;  Laterality: N/A;   DILATION AND CURETTAGE OF UTERUS N/A 04/30/2019   Procedure: SUCTION DILATATION AND CURETTAGE;  Surgeon: Wendelyn Halter, MD;  Location: [facility];  Service: Gynecology;  Laterality: N/A;   TONSILLECTOMY      Family History  Problem Relation Age of Onset   Diabetes Mother    Arthritis Mother    Other Mother        auto immune disease   Depression Mother    Kidney disease Mother    Cancer Maternal Grandmother        cervical   Depression Maternal Grandmother    Cancer Maternal Grandfather        lung that went to brain   Other Paternal Grandfather        has a pacemaker    Social History   Tobacco Use   Smoking status: Never   Smokeless tobacco: Never  Vaping Use   Vaping status: Some Days   Start date: 05/13/2014   Substances: Flavoring  Substance Use Topics   Alcohol use: Yes    Comment: Occas   Drug use: No     No Known Allergies  Review of Systems NEGATIVE UNLESS OTHERWISE INDICATED IN HPI      Objective:     BP 118/70 (BP Location: Left Arm, Patient Position: Sitting, Cuff Size: Normal)   Pulse 87   Temp 98.2 F (36.8 C) (Temporal)   Ht 5\' 5"  (1.651 m)   Wt 244 lb 6.4 oz (110.9 kg)   LMP 04/03/2023 (Exact Date)   SpO2 99%   BMI 40.67 kg/m   Wt Readings from Last 3 Encounters:  04/29/23 244 lb 6.4 oz (110.9 kg)  11/23/22 230 lb (104.3  kg)  09/10/22 247 lb 8 oz (112.3 kg)    BP Readings from Last 3 Encounters:  04/29/23 118/70  11/23/22 113/74  11/23/22 124/87     Physical Exam Vitals and nursing note reviewed.  Constitutional:      General: She is not in acute distress.    Appearance: Normal appearance. She is obese. She is not ill-appearing.  HENT:     Head: Normocephalic.     Right Ear: External ear normal.     Left Ear: External ear normal.  Eyes:     Extraocular Movements: Extraocular movements intact.     Conjunctiva/sclera: Conjunctivae normal.     Pupils: Pupils are equal, round, and reactive to light.  Cardiovascular:     Rate and Rhythm: Normal rate and regular rhythm.     Pulses: Normal pulses.     Heart sounds: Normal heart sounds. No murmur heard. Pulmonary:     Effort: Pulmonary effort is normal. No respiratory distress.     Breath sounds: Normal breath  sounds. No wheezing.  Musculoskeletal:     Cervical back: Normal range of motion.  Skin:    General: Skin is warm.  Neurological:     Mental Status: She is alert and oriented to person, place, and time.  Psychiatric:        Mood and Affect: Mood normal.        Behavior: Behavior normal.             Anajah Sterbenz M Troyce Gieske, PA-C

## 2023-04-30 ENCOUNTER — Other Ambulatory Visit (HOSPITAL_COMMUNITY): Payer: Self-pay

## 2023-04-30 ENCOUNTER — Telehealth: Payer: Self-pay

## 2023-04-30 NOTE — Telephone Encounter (Signed)
 Pharmacy Patient Advocate Encounter   Received notification from CoverMyMeds that prior authorization for Wegovy  0.25MG /0.5ML auto-injectors is required/requested.   Insurance verification completed.   The patient is insured through [facility] .   Per test claim: PA required; PA submitted to above mentioned insurance via CoverMyMeds Key/confirmation #/EOC (Key: [id number])   Status is pending

## 2023-05-01 ENCOUNTER — Other Ambulatory Visit (HOSPITAL_COMMUNITY): Payer: Self-pay

## 2023-05-01 NOTE — Telephone Encounter (Signed)
 Pharmacy Patient Advocate Encounter  Received notification from [facility] that Prior Authorization for Wegovy  0.25MG /0.5ML auto-injectors has been APPROVED from 4.9.25 to 10.29.25. Ran test claim, Copay is $4.00. This test claim was processed through [facility]- copay amounts may vary at other pharmacies due to pharmacy/plan contracts, or as the patient moves through the different stages of their insurance plan.   PA #/Case ID/Reference #: (Key: [id number])

## 2023-05-26 ENCOUNTER — Encounter: Payer: Self-pay | Admitting: Physician Assistant

## 2023-05-28 ENCOUNTER — Other Ambulatory Visit: Payer: Self-pay

## 2023-05-28 MED ORDER — WEGOVY 0.5 MG/0.5ML ~~LOC~~ SOAJ: 0.5000 mg | SUBCUTANEOUS | 0 refills | Status: DC

## 2023-05-28 NOTE — Telephone Encounter (Signed)
 Please advise if ok to increase dose for next refill

## 2023-06-23 ENCOUNTER — Other Ambulatory Visit: Payer: Self-pay | Admitting: Physician Assistant

## 2023-06-23 MED ORDER — ONDANSETRON 4 MG PO TBDP: 4.0000 mg | ORAL_TABLET | Freq: Three times a day (TID) | ORAL | 0 refills | Status: DC | PRN

## 2023-07-12 ENCOUNTER — Ambulatory Visit

## 2023-07-29 ENCOUNTER — Encounter: Admitting: Physician Assistant

## 2023-07-29 ENCOUNTER — Encounter: Payer: Self-pay | Admitting: Physician Assistant

## 2023-07-29 ENCOUNTER — Ambulatory Visit (INDEPENDENT_AMBULATORY_CARE_PROVIDER_SITE_OTHER): Admitting: Physician Assistant

## 2023-07-29 ENCOUNTER — Other Ambulatory Visit (HOSPITAL_COMMUNITY)
Admission: RE | Admit: 2023-07-29 | Discharge: 2023-07-29 | Disposition: A | Discharge status: Home / Self Care | Source: Ambulatory Visit | Attending: Physician Assistant | Admitting: Physician Assistant

## 2023-07-29 VITALS — BP 124/78 | HR 101 | Temp 98.2°F | Ht 65.0 in | Wt 249.8 lb

## 2023-07-29 DIAGNOSIS — Z131 Encounter for screening for diabetes mellitus: Secondary | ICD-10-CM

## 2023-07-29 DIAGNOSIS — Z113 Encounter for screening for infections with a predominantly sexual mode of transmission: Secondary | ICD-10-CM | POA: Insufficient documentation

## 2023-07-29 DIAGNOSIS — F419 Anxiety disorder, unspecified: Secondary | ICD-10-CM

## 2023-07-29 DIAGNOSIS — F32A Depression, unspecified: Secondary | ICD-10-CM

## 2023-07-29 DIAGNOSIS — Z0000 Encounter for general adult medical examination without abnormal findings: Secondary | ICD-10-CM | POA: Diagnosis not present

## 2023-07-29 LAB — COMPREHENSIVE METABOLIC PANEL WITH GFR
ALT: 46 U/L — ABNORMAL HIGH (ref 0–35)
AST: 30 U/L (ref 0–37)
Albumin: 3.9 g/dL (ref 3.5–5.2)
Alkaline Phosphatase: 46 U/L (ref 39–117)
BUN: 10 mg/dL (ref 6–23)
CO2: 28 meq/L (ref 19–32)
Calcium: 8.8 mg/dL (ref 8.4–10.5)
Chloride: 105 meq/L (ref 96–112)
Creatinine, Ser: 0.61 mg/dL (ref 0.40–1.20)
GFR: 123.03 mL/min (ref >60.00)
Glucose, Bld: 87 mg/dL (ref 70–99)
Potassium: 4 meq/L (ref 3.5–5.1)
Sodium: 140 meq/L (ref 135–145)
Total Bilirubin: 0.5 mg/dL (ref 0.2–1.2)
Total Protein: 6.7 g/dL (ref 6.0–8.3)

## 2023-07-29 LAB — HEMOGLOBIN A1C: Hgb A1c MFr Bld: 5.3 % (ref 4.6–6.5)

## 2023-07-29 LAB — CBC WITH DIFFERENTIAL/PLATELET
Basophils Absolute: 0.1 K/uL (ref 0.0–0.1)
Basophils Relative: 0.9 % (ref 0.0–3.0)
Eosinophils Absolute: 0.3 K/uL (ref 0.0–0.7)
Eosinophils Relative: 3.8 % (ref 0.0–5.0)
HCT: 36.6 % (ref 36.0–46.0)
Hemoglobin: 12.4 g/dL (ref 12.0–15.0)
Lymphocytes Relative: 35.6 % (ref 12.0–46.0)
Lymphs Abs: 2.8 K/uL (ref 0.7–4.0)
MCHC: 33.8 g/dL (ref 30.0–36.0)
MCV: 81.6 fl (ref 78.0–100.0)
Monocytes Absolute: 0.5 K/uL (ref 0.1–1.0)
Monocytes Relative: 6.7 % (ref 3.0–12.0)
Neutro Abs: 4.2 K/uL (ref 1.4–7.7)
Neutrophils Relative %: 53 % (ref 43.0–77.0)
Platelets: 396 K/uL (ref 150.0–400.0)
RBC: 4.48 Mil/uL (ref 3.87–5.11)
RDW: 16.3 % — ABNORMAL HIGH (ref 11.5–15.5)
WBC: 7.9 K/uL (ref 4.0–10.5)

## 2023-07-29 LAB — LIPID PANEL
Cholesterol: 186 mg/dL (ref 0–200)
HDL: 53.3 mg/dL (ref >39.00)
LDL Cholesterol: 102 mg/dL — ABNORMAL HIGH (ref 0–99)
NonHDL: 133.17
Total CHOL/HDL Ratio: 3
Triglycerides: 156 mg/dL — ABNORMAL HIGH (ref 0.0–149.0)
VLDL: 31.2 mg/dL (ref 0.0–40.0)

## 2023-07-29 MED ORDER — FLUOXETINE HCL 40 MG PO CAPS
40.0000 mg | ORAL_CAPSULE | Freq: Every day | ORAL | 5 refills | Status: AC
Start: 2023-07-29 — End: ?

## 2023-07-29 NOTE — Progress Notes (Signed)
 Patient ID: Rebecca Gibbs, female    DOB: Jan 03, 1995, 28 y.o.   MRN: [id number]   Assessment & Plan:  Annual physical exam -     CBC with Differential/Platelet -     Comprehensive metabolic panel with GFR -     Hemoglobin A1c -     Lipid panel -     TSH  Anxiety and depression -     FLUoxetine  HCl; Take 1 capsule (40 mg total) by mouth daily.  Dispense: 30 capsule; Refill: 5  Screen for STD (sexually transmitted disease) -     HIV Antibody (routine testing w rflx) -     RPR -     Urine cytology ancillary only      Assessment and Plan Assessment & Plan Adult Wellness Visit 28 year old female presenting for regular annual physical. No major medical changes since last visit. Vaccinations and cervical cancer screening are up to date. No symptoms reported. - Order CBC, CMP, A1c, lipid panel, TSH - Advise regular cardiovascular exercise - Advise incorporating more protein and vegetables into diet  Depression and anxiety Depression and anxiety well-managed with Prozac  40 mg. No reported issues with current medication regimen. - Continue Prozac  40 mg - Send medication refill for Prozac   Screening for sexually transmitted infections Sexually active female, not recently tested for STIs. No symptoms reported but advised to screen due to high prevalence of STIs. - Order urine cytology for gonorrhea, chlamydia, trichomoniasis - Order HIV and RPR tests   Age-appropriate screening and counseling performed today. Will check labs and call with results. Preventive measures discussed and printed in AVS for patient.   Patient Counseling: [x]   Nutrition: Stressed importance of moderation in sodium/caffeine intake, saturated fat and cholesterol, caloric balance, sufficient intake of fresh fruits, vegetables, and fiber.  [x]   Stressed the importance of regular exercise.   [x]   Substance Abuse: Discussed cessation/primary prevention of tobacco, alcohol, or other drug use; driving or other  dangerous activities under the influence; availability of treatment for abuse.   [x]   Injury prevention: Discussed safety belts, safety helmets, smoke detector, smoking near bedding or upholstery.   [x]   Sexuality: Discussed sexually transmitted diseases, partner selection, use of condoms, avoidance of unintended pregnancy  and contraceptive alternatives.   [x]   Dental health: Discussed importance of regular tooth brushing, flossing, and dental visits.  [x]   Health maintenance and immunizations reviewed. Please refer to Health maintenance section.       Return in about 1 year (around 07/28/2024) for physical.    Subjective:    Chief Complaint  Patient presents with   Annual Exam    Pt in office for annual CPE and pt not fasting for labs; pt states she is taking 2 Prozac  daily,    HPI Discussed the use of AI scribe software for clinical note transcription with the patient, who gave verbal consent to proceed.  History of Present Illness [name] is a 28 year old female who presents for her regular annual physical examination. She is accompanied by her younger daughter.  She is here to update her lab work, including CBC, CMP, A1c, HIV, lipid panel, RPR, TSH, and urine cytology for STD screening. She has a history of anxiety and depression, currently managed with Prozac  40 mg daily, which effectively controls her symptoms.  Her iron  levels have improved to a healthy, normal range after her menstrual period, allowing her to donate blood again. She attributes this improvement to regular multivitamin use.  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 She is satisfied with her job and notes that her manager is accommodating with her schedule.  No current symptoms related to sexually transmitted infections, but she agrees to undergo testing as a precaution. She is sexually active but has not been tested  recently.  She denies smoking or vaping and reports occasional alcohol use.     Past Medical History:  Diagnosis Date   History of gestational diabetes    Pregnant 01/07/2014   Raynaud's disease     Past Surgical History:  Procedure Laterality Date   CHOLECYSTECTOMY N/A 05/19/2018   Procedure: LAPAROSCOPIC CHOLECYSTECTOMY;  Surgeon: Kallie Manuelita BROCKS, MD;  Location: [facility];  Service: General;  Laterality: N/A;   DILATION AND CURETTAGE OF UTERUS N/A 04/30/2019   Procedure: SUCTION DILATATION AND CURETTAGE;  Surgeon: Jayne Vonn DEL, MD;  Location: [facility];  Service: Gynecology;  Laterality: N/A;   TONSILLECTOMY      Family History  Problem Relation Age of Onset   Diabetes Mother    Arthritis Mother    Other Mother        auto immune disease   Depression Mother    Kidney disease Mother    Cancer Maternal Grandmother        cervical   Depression Maternal Grandmother    Cancer Maternal Grandfather        lung that went to brain   Other Paternal Grandfather        has a pacemaker    Social History   Tobacco Use   Smoking status: Never   Smokeless tobacco: Never  Vaping Use   Vaping status: Some Days   Start date: 05/13/2014   Substances: Flavoring  Substance Use Topics   Alcohol use: Yes    Comment: Occas   Drug use: No     No Known Allergies  Review of Systems NEGATIVE UNLESS OTHERWISE INDICATED IN HPI      Objective:     BP 124/78 (BP Location: Left Arm, Patient Position: Sitting, Cuff Size: Normal)   Pulse (!) 101   Temp 98.2 F (36.8 C) (Temporal)   Ht 5' 5 (1.651 m) Comment: no height taken per patient req  Wt 249 lb 12.8 oz (113.3 kg) Comment: wanted to weight with shoes  LMP 07/18/2023 (Exact Date)   SpO2 98%   BMI 41.57 kg/m   Wt Readings from Last 3 Encounters:  07/29/23 249 lb 12.8 oz (113.3 kg)  04/29/23 244 lb 6.4 oz (110.9 kg)  11/23/22 230 lb (104.3 kg)    BP Readings from Last 3 Encounters:  07/29/23 124/78  04/29/23 122/84   11/23/22 113/74     Physical Exam Vitals and nursing note reviewed.  Constitutional:      Appearance: Normal appearance. She is obese. She is not toxic-appearing.  HENT:     Head: Normocephalic and atraumatic.     Right Ear: Tympanic membrane, ear canal and external ear normal.     Left Ear: Tympanic membrane, ear canal and external ear normal.     Nose: Nose normal.     Mouth/Throat:     Mouth: Mucous membranes are moist.  Eyes:     Extraocular Movements: Extraocular movements intact.     Conjunctiva/sclera: Conjunctivae normal.     Pupils: Pupils are equal, round, and reactive to light.  Cardiovascular:     Rate and Rhythm:  Normal rate and regular rhythm.     Pulses: Normal pulses.     Heart sounds: Normal heart sounds.  Pulmonary:     Effort: Pulmonary effort is normal.     Breath sounds: Normal breath sounds.  Abdominal:     General: Abdomen is flat. Bowel sounds are normal.     Palpations: Abdomen is soft.  Musculoskeletal:        General: Normal range of motion.     Cervical back: Normal range of motion and neck supple.  Skin:    General: Skin is warm and dry.  Neurological:     General: No focal deficit present.     Mental Status: She is alert and oriented to person, place, and time.  Psychiatric:        Mood and Affect: Mood normal.        Behavior: Behavior normal.        Thought Content: Thought content normal.        Judgment: Judgment normal.             Trent Theisen M Donata Reddick, PA-C

## 2023-07-30 ENCOUNTER — Ambulatory Visit: Payer: Self-pay | Admitting: Physician Assistant

## 2023-07-30 LAB — TSH: TSH: 2.23 u[IU]/mL (ref 0.35–5.50)

## 2023-07-30 LAB — URINE CYTOLOGY ANCILLARY ONLY
Chlamydia: NEGATIVE
Comment: NEGATIVE
Comment: NEGATIVE
Comment: NORMAL
Neisseria Gonorrhea: NEGATIVE
Trichomonas: NEGATIVE

## 2023-07-30 LAB — HIV ANTIBODY (ROUTINE TESTING W REFLEX): HIV 1&2 Ab, 4th Generation: NONREACTIVE

## 2023-07-30 LAB — RPR: RPR Ser Ql: NONREACTIVE

## 2023-08-06 ENCOUNTER — Encounter: Payer: Self-pay | Admitting: Physician Assistant

## 2023-08-06 NOTE — Telephone Encounter (Signed)
 Please see pt request for ESA letter and advise

## 2023-08-12 ENCOUNTER — Ambulatory Visit
Admission: RE | Admit: 2023-08-12 | Discharge: 2023-08-12 | Disposition: A | Discharge status: Home / Self Care | Source: Ambulatory Visit | Attending: Nurse Practitioner | Admitting: Nurse Practitioner

## 2023-08-12 VITALS — BP 140/86 | HR 97 | Temp 98.6°F | Resp 16

## 2023-08-12 DIAGNOSIS — Z8739 Personal history of other diseases of the musculoskeletal system and connective tissue: Secondary | ICD-10-CM | POA: Diagnosis not present

## 2023-08-12 DIAGNOSIS — S39012A Strain of muscle, fascia and tendon of lower back, initial encounter: Secondary | ICD-10-CM | POA: Diagnosis not present

## 2023-08-12 MED ORDER — TIZANIDINE HCL 4 MG PO TABS: 4.0000 mg | ORAL_TABLET | Freq: Three times a day (TID) | ORAL | 0 refills | Status: AC | PRN

## 2023-08-12 NOTE — ED Triage Notes (Signed)
 Back pain x 1 day, after moving a couch in her home.

## 2023-08-12 NOTE — Discharge Instructions (Signed)
 Take medication as prescribed. Try to remain as active as possible. Gentle range of motion and stretching exercises to help with back spasm and pain. May apply ice or heat as needed.  Ice is recommended for pain or swelling, heat for spasm or stiffness.  Apply for 20 minutes, remove for 1 hour, then repeat. You may take over-the-counter Tylenol  or ibuprofen  as needed for pain, fever, or general discomfort. Go to the emergency department immediately if you develop weakness in your legs or feet, inability to walk, loss of bowel or bladder function, difficulty urinating or passing a bowel movement, or other concerns. Follow-up with your primary care physician if your symptoms do not improve.  Follow-up as needed.

## 2023-08-12 NOTE — ED Provider Notes (Signed)
 [facility]    CSN: [id number] Arrival date & time: 08/12/23  1149      History   Chief Complaint Chief Complaint  Patient presents with   Back Pain    Entered by patient    HPI Rebecca Gibbs is a 28 y.o. female.   The history is provided by the patient.   Patient presents for complaints of low back pain that started after she was moving furniture 1 day ago.  Patient states the pain is located in the bilateral lower back.  She denies numbness, tingling, radiation of pain, loss of bowel or bladder function, or saddle anesthesia.  Patient reports that she is also been having spasms in her back.  Patient reports prior history of same since she was a teenager.  Patient is also requesting a work note.  Past Medical History:  Diagnosis Date   History of gestational diabetes    Pregnant 01/07/2014   Raynaud's disease     Patient Active Problem List   Diagnosis Date Noted   Calculus of gallbladder without cholecystitis without obstruction 05/08/2018    Past Surgical History:  Procedure Laterality Date   CHOLECYSTECTOMY N/A 05/19/2018   Procedure: LAPAROSCOPIC CHOLECYSTECTOMY;  Surgeon: Kallie Manuelita BROCKS, MD;  Location: [facility];  Service: General;  Laterality: N/A;   DILATION AND CURETTAGE OF UTERUS N/A 04/30/2019   Procedure: SUCTION DILATATION AND CURETTAGE;  Surgeon: Jayne Vonn DEL, MD;  Location: [facility];  Service: Gynecology;  Laterality: N/A;   TONSILLECTOMY      OB History     Gravida  3   Para  1   Term  1   Preterm      AB  2   Living  1      SAB  1   IAB      Ectopic      Multiple  0   Live Births  1            Home Medications    Prior to Admission medications   Medication Sig Start Date End Date Taking? Authorizing Provider  FLUoxetine  (PROZAC ) 40 MG capsule Take 1 capsule (40 mg total) by mouth daily. 07/29/23   Allwardt, Alyssa M, PA-C  valACYclovir  (VALTREX ) 500 MG tablet Take 1 tablet (500 mg total) by mouth daily.  04/17/23 04/11/24  Ozan, Jennifer, DO    Family History Family History  Problem Relation Age of Onset   Diabetes Mother    Arthritis Mother    Other Mother        auto immune disease   Depression Mother    Kidney disease Mother    Cancer Maternal Grandmother        cervical   Depression Maternal Grandmother    Cancer Maternal Grandfather        lung that went to brain   Other Paternal Grandfather        has a pacemaker    Social History Social History   Tobacco Use   Smoking status: Never   Smokeless tobacco: Never  Vaping Use   Vaping status: Some Days   Start date: 05/13/2014   Substances: Flavoring  Substance Use Topics   Alcohol use: Yes    Comment: Occas   Drug use: No     Allergies   Patient has no known allergies.   Review of Systems Review of Systems Per HPI  Physical Exam Triage Vital Signs ED Triage Vitals  Encounter Vitals Group  BP 08/12/23 1157 (!) 140/86     Girls Systolic BP Percentile --      Girls Diastolic BP Percentile --      Boys Systolic BP Percentile --      Boys Diastolic BP Percentile --      Pulse Rate 08/12/23 1157 97     Resp 08/12/23 1157 16     Temp 08/12/23 1157 98.6 F (37 C)     Temp Source 08/12/23 1157 Oral     SpO2 08/12/23 1157 97 %     Weight --      Height --      Head Circumference --      Peak Flow --      Pain Score 08/12/23 1158 8     Pain Loc --      Pain Education --      Exclude from Growth Chart --    No data found.  Updated Vital Signs BP (!) 140/86 (BP Location: Right Arm)   Pulse 97   Temp 98.6 F (37 C) (Oral)   Resp 16   LMP 07/18/2023 (Exact Date)   SpO2 97%   Visual Acuity Right Eye Distance:   Left Eye Distance:   Bilateral Distance:    Right Eye Near:   Left Eye Near:    Bilateral Near:     Physical Exam Vitals and nursing note reviewed.  Constitutional:      General: She is not in acute distress.    Appearance: Normal appearance.  HENT:     Head: Normocephalic.   Eyes:     Extraocular Movements: Extraocular movements intact.     Pupils: Pupils are equal, round, and reactive to light.  Cardiovascular:     Rate and Rhythm: Normal rate and regular rhythm.     Pulses: Normal pulses.     Heart sounds: Normal heart sounds.  Pulmonary:     Effort: Pulmonary effort is normal. No respiratory distress.     Breath sounds: Normal breath sounds. No stridor. No wheezing, rhonchi or rales.  Abdominal:     General: Bowel sounds are normal.     Palpations: Abdomen is soft.     Tenderness: There is no abdominal tenderness.  Musculoskeletal:     Cervical back: Normal range of motion.     Lumbar back: Spasms and tenderness present. No swelling or deformity. Decreased range of motion (d/t pain). Negative right straight leg raise test and negative left straight leg raise test.  Skin:    General: Skin is warm and dry.  Neurological:     General: No focal deficit present.     Mental Status: She is alert and oriented to person, place, and time.  Psychiatric:        Mood and Affect: Mood normal.        Behavior: Behavior normal.      UC Treatments / Results  Labs (all labs ordered are listed, but only abnormal results are displayed) Labs Reviewed - No data to display  EKG   Radiology No results found.  Procedures Procedures (including critical care time)  Medications Ordered in UC Medications - No data to display  Initial Impression / Assessment and Plan / UC Course  I have reviewed the triage vital signs and the nursing notes.  Pertinent labs & imaging results that were available during my care of the patient were reviewed by me and considered in my medical decision making (see chart for details).  Symptoms consistent  with a lumbar strain.  Will treat with tizanidine  4 mg for back spasm and stiffness.  Supportive care recommendations were provided discussed with the patient to include over-the-counter analgesics, and the use of ice or heat.   Patient was also given stretching exercises to perform at least 2-3 times daily.  Discussed strict ER follow-up precautions with the patient.  Patient was in agreement with this plan of care and verbalizes understanding.  All questions were answered.  Patient stable for discharge.  Work note was provided.   Final Clinical Impressions(s) / UC Diagnoses   Final diagnoses:  None   Discharge Instructions   None    ED Prescriptions   None    PDMP not reviewed this encounter.   Gilmer Etta PARAS, NP 08/12/23 1215

## 2023-08-15 ENCOUNTER — Ambulatory Visit
Admission: EM | Admit: 2023-08-15 | Discharge: 2023-08-15 | Disposition: A | Discharge status: Home / Self Care | Attending: Family Medicine | Admitting: Family Medicine

## 2023-08-15 DIAGNOSIS — R109 Unspecified abdominal pain: Secondary | ICD-10-CM | POA: Diagnosis not present

## 2023-08-15 DIAGNOSIS — R6883 Chills (without fever): Secondary | ICD-10-CM | POA: Diagnosis not present

## 2023-08-15 DIAGNOSIS — R197 Diarrhea, unspecified: Secondary | ICD-10-CM

## 2023-08-15 MED ORDER — LOPERAMIDE HCL 2 MG PO CAPS: 2.0000 mg | ORAL_CAPSULE | Freq: Four times a day (QID) | ORAL | 0 refills | Status: AC | PRN

## 2023-08-15 NOTE — ED Triage Notes (Signed)
 Pt reports diarrhea, abdominal pain, chills,hot flashes, lightheaded, stomach feels like a volcano and does this bubbling thing that's really uncomfortable ongoing x 1 day, had to leave work and would like a work note.

## 2023-08-18 NOTE — ED Provider Notes (Signed)
 [facility]    CSN: [id number] Arrival date & time: 08/15/23  1004      History   Chief Complaint No chief complaint on file.   HPI Rebecca Gibbs is a 28 y.o. female.   Presenting today with 1 day history of diarrhea, abdominal discomfort, chills, hot flashes. Denies fever, vomiting, CP, SOB, melena, hematemesis, new foods or medications, recent travel outside country, known sick contacts. So far not trying anything OTC for sxs. Requesting work note.     Past Medical History:  Diagnosis Date   History of gestational diabetes    Pregnant 01/07/2014   Raynaud's disease     Patient Active Problem List   Diagnosis Date Noted   Calculus of gallbladder without cholecystitis without obstruction 05/08/2018    Past Surgical History:  Procedure Laterality Date   CHOLECYSTECTOMY N/A 05/19/2018   Procedure: LAPAROSCOPIC CHOLECYSTECTOMY;  Surgeon: Kallie Manuelita BROCKS, MD;  Location: [facility];  Service: General;  Laterality: N/A;   DILATION AND CURETTAGE OF UTERUS N/A 04/30/2019   Procedure: SUCTION DILATATION AND CURETTAGE;  Surgeon: Jayne Vonn DEL, MD;  Location: [facility];  Service: Gynecology;  Laterality: N/A;   TONSILLECTOMY      OB History     Gravida  3   Para  1   Term  1   Preterm      AB  2   Living  1      SAB  1   IAB      Ectopic      Multiple  0   Live Births  1            Home Medications    Prior to Admission medications   Medication Sig Start Date End Date Taking? Authorizing Provider  loperamide  (IMODIUM ) 2 MG capsule Take 1 capsule (2 mg total) by mouth 4 (four) times daily as needed for diarrhea or loose stools. 08/15/23  Yes Stuart Vernell Norris, PA-C  FLUoxetine  (PROZAC ) 40 MG capsule Take 1 capsule (40 mg total) by mouth daily. 07/29/23   Allwardt, Alyssa M, PA-C  tiZANidine  (ZANAFLEX ) 4 MG tablet Take 1 tablet (4 mg total) by mouth every 8 (eight) hours as needed for muscle spasms. 08/12/23   Leath-Warren, Etta PARAS, NP   valACYclovir  (VALTREX ) 500 MG tablet Take 1 tablet (500 mg total) by mouth daily. 04/17/23 04/11/24  Ozan, Jennifer, DO    Family History Family History  Problem Relation Age of Onset   Diabetes Mother    Arthritis Mother    Other Mother        auto immune disease   Depression Mother    Kidney disease Mother    Cancer Maternal Grandmother        cervical   Depression Maternal Grandmother    Cancer Maternal Grandfather        lung that went to brain   Other Paternal Grandfather        has a pacemaker    Social History Social History   Tobacco Use   Smoking status: Never   Smokeless tobacco: Never  Vaping Use   Vaping status: Some Days   Start date: 05/13/2014   Substances: Flavoring  Substance Use Topics   Alcohol use: Yes    Comment: Occas   Drug use: No     Allergies   Patient has no known allergies.   Review of Systems Review of Systems PER HPI  Physical Exam Triage Vital Signs ED Triage Vitals  Encounter Vitals Group     BP 08/15/23 1017 122/78     Girls Systolic BP Percentile --      Girls Diastolic BP Percentile --      Boys Systolic BP Percentile --      Boys Diastolic BP Percentile --      Pulse Rate 08/15/23 1017 87     Resp 08/15/23 1017 20     Temp 08/15/23 1017 98.3 F (36.8 C)     Temp Source 08/15/23 1017 Oral     SpO2 08/15/23 1017 99 %     Weight --      Height --      Head Circumference --      Peak Flow --      Pain Score 08/15/23 1018 8     Pain Loc --      Pain Education --      Exclude from Growth Chart --    No data found.  Updated Vital Signs BP 122/78 (BP Location: Right Arm)   Pulse 87   Temp 98.3 F (36.8 C) (Oral)   Resp 20   LMP 08/14/2023 (Exact Date)   SpO2 99%   Visual Acuity Right Eye Distance:   Left Eye Distance:   Bilateral Distance:    Right Eye Near:   Left Eye Near:    Bilateral Near:     Physical Exam Vitals and nursing note reviewed.  Constitutional:      Appearance: Normal appearance.  She is not ill-appearing.  HENT:     Head: Atraumatic.  Eyes:     Extraocular Movements: Extraocular movements intact.     Conjunctiva/sclera: Conjunctivae normal.  Cardiovascular:     Rate and Rhythm: Normal rate and regular rhythm.     Heart sounds: Normal heart sounds.  Pulmonary:     Effort: Pulmonary effort is normal.     Breath sounds: Normal breath sounds.  Abdominal:     General: Bowel sounds are normal. There is no distension.     Palpations: Abdomen is soft.     Tenderness: There is no abdominal tenderness. There is no right CVA tenderness, left CVA tenderness or guarding.  Musculoskeletal:        General: Normal range of motion.     Cervical back: Normal range of motion and neck supple.  Skin:    General: Skin is warm and dry.  Neurological:     Mental Status: She is alert and oriented to person, place, and time.  Psychiatric:        Mood and Affect: Mood normal.        Thought Content: Thought content normal.        Judgment: Judgment normal.      UC Treatments / Results  Labs (all labs ordered are listed, but only abnormal results are displayed) Labs Reviewed - No data to display  EKG   Radiology No results found.  Procedures Procedures (including critical care time)  Medications Ordered in UC Medications - No data to display  Initial Impression / Assessment and Plan / UC Course  I have reviewed the triage vital signs and the nursing notes.  Pertinent labs & imaging results that were available during my care of the patient were reviewed by me and considered in my medical decision making (see chart for details).     Suspect viral GI illness. Vitals and exam reassuring, treat with imodium  prn, BRAT diet, fluids, rest. Work note given. Return for worsening sxs.  Final Clinical Impressions(s) / UC Diagnoses   Final diagnoses:  Diarrhea, unspecified type  Abdominal cramping  Chills   Discharge Instructions   None    ED Prescriptions      Medication Sig Dispense Auth. Provider   loperamide  (IMODIUM ) 2 MG capsule Take 1 capsule (2 mg total) by mouth 4 (four) times daily as needed for diarrhea or loose stools. 12 capsule Stuart Vernell Norris, [place]      PDMP not reviewed this encounter.   Stuart Vernell Norris, [place] 08/18/23 1814

## 2023-08-19 NOTE — Telephone Encounter (Signed)
Please see patient message and advise.

## 2023-09-09 ENCOUNTER — Encounter: Payer: Self-pay | Admitting: Physician Assistant

## 2023-09-10 ENCOUNTER — Other Ambulatory Visit: Payer: Self-pay

## 2023-09-10 DIAGNOSIS — Z111 Encounter for screening for respiratory tuberculosis: Secondary | ICD-10-CM

## 2023-09-12 ENCOUNTER — Other Ambulatory Visit

## 2023-09-18 ENCOUNTER — Other Ambulatory Visit (INDEPENDENT_AMBULATORY_CARE_PROVIDER_SITE_OTHER)

## 2023-09-18 ENCOUNTER — Ambulatory Visit (INDEPENDENT_AMBULATORY_CARE_PROVIDER_SITE_OTHER)

## 2023-09-18 DIAGNOSIS — Z23 Encounter for immunization: Secondary | ICD-10-CM | POA: Diagnosis not present

## 2023-09-18 DIAGNOSIS — Z111 Encounter for screening for respiratory tuberculosis: Secondary | ICD-10-CM | POA: Diagnosis not present

## 2023-09-18 NOTE — Progress Notes (Signed)
 Patient is in office today for a nurse visit for flu shot, per PCP's order. Patient Injection was given in the  Right deltoid. Patient tolerated injection well.

## 2023-09-22 ENCOUNTER — Ambulatory Visit: Payer: Self-pay | Admitting: Physician Assistant

## 2023-09-22 LAB — QUANTIFERON-TB GOLD PLUS
Mitogen-NIL: 7.37 [IU]/mL
NIL: 0.03 [IU]/mL
QuantiFERON-TB Gold Plus: NEGATIVE
TB1-NIL: 0 [IU]/mL
TB2-NIL: 0 [IU]/mL

## 2023-09-23 ENCOUNTER — Ambulatory Visit: Admitting: Obstetrics & Gynecology

## 2023-09-23 ENCOUNTER — Encounter: Payer: Self-pay | Admitting: *Deleted

## 2023-09-23 ENCOUNTER — Encounter: Payer: Self-pay | Admitting: Obstetrics & Gynecology

## 2023-09-23 VITALS — BP 148/84 | HR 80 | Ht 62.0 in | Wt 257.0 lb

## 2023-09-23 DIAGNOSIS — Z30430 Encounter for insertion of intrauterine contraceptive device: Secondary | ICD-10-CM | POA: Diagnosis not present

## 2023-09-23 DIAGNOSIS — Z3202 Encounter for pregnancy test, result negative: Secondary | ICD-10-CM

## 2023-09-23 LAB — POCT URINE PREGNANCY: Preg Test, Ur: NEGATIVE

## 2023-09-23 MED ORDER — LEVONORGESTREL 20 MCG/DAY IU IUD
1.0000 | INTRAUTERINE_SYSTEM | Freq: Once | INTRAUTERINE | Status: AC
  Administered 2023-09-23: 1 via INTRAUTERINE

## 2023-09-23 NOTE — Progress Notes (Signed)
 IUD Insertion Procedure Note LMP 09/12/23 Last sex 08/31/23 UPT negative today  Pre-operative Diagnosis: parous female desires LARC for contracetion  Post-operative Diagnosis: same  Indications: contraception  Procedure Details  Urine pregnancy test was done today and result was negative.  The risks (including infection, bleeding, pain, and uterine perforation) and benefits of the procedure were explained to the patient and Written informed consent was obtained.    Cervix cleansed with Betadine. Uterus sounded to 8 cm. IUD inserted without difficulty. String visible and trimmed. Patient tolerated procedure well.  IUD Information: Mirena , Lot # [id number], Expiration date 2027 AUG.  Condition: Stable  Complications: None  Plan:  The patient was advised to call for any fever or for prolonged or severe pain or bleeding. She was advised to use OTC ibuprofen  as needed for mild to moderate pain.   Attending Physician Documentation: I placed the IUD

## 2023-09-30 ENCOUNTER — Encounter: Payer: Self-pay | Admitting: Physician Assistant

## 2023-09-30 ENCOUNTER — Telehealth (INDEPENDENT_AMBULATORY_CARE_PROVIDER_SITE_OTHER): Admitting: Physician Assistant

## 2023-09-30 ENCOUNTER — Other Ambulatory Visit (HOSPITAL_COMMUNITY): Payer: Self-pay

## 2023-09-30 ENCOUNTER — Telehealth: Payer: Self-pay

## 2023-09-30 VITALS — Ht 62.0 in | Wt 258.0 lb

## 2023-09-30 DIAGNOSIS — E66813 Obesity, class 3: Secondary | ICD-10-CM | POA: Diagnosis not present

## 2023-09-30 DIAGNOSIS — Z6841 Body Mass Index (BMI) 40.0 and over, adult: Secondary | ICD-10-CM

## 2023-09-30 DIAGNOSIS — Z713 Dietary counseling and surveillance: Secondary | ICD-10-CM | POA: Diagnosis not present

## 2023-09-30 MED ORDER — PHENTERMINE-TOPIRAMATE ER 3.75-23 MG PO CP24: 1.0000 | ORAL_CAPSULE | Freq: Every morning | ORAL | 0 refills | Status: AC

## 2023-09-30 NOTE — Progress Notes (Signed)
   Virtual Visit via Video Note  I connected with  Rebecca Gibbs  on 09/30/23 at  9:30 AM EDT by a video enabled telemedicine application and verified that I am speaking with the correct person using two identifiers.  Location: Patient: work office in [employer]  Provider: Nature conservation officer at [employer] Persons present: Patient and myself   I discussed the limitations of evaluation and management by telemedicine and the availability of in person appointments. The patient expressed understanding and agreed to proceed.   History of Present Illness:  Discussed the use of AI scribe software for clinical note transcription with the patient, who gave verbal consent to proceed.  History of Present Illness Rebecca Gibbs is a 28 year old female who presents for weight loss counseling.  She previously used Wegovy  for weight loss but discontinued it due to severe nausea, which she associated with interactions with sugar, caffeine, and carbonation. Initially, she lost about ten pounds with Wegovy , but she stopped taking it because it made her feel very sick, especially after a dosage increase around July.  She is exploring other weight loss options due to changes in her Medicaid coverage, which will no longer cover certain medications like Wegovy  after October 1st. She inquired about potential interactions with her Mirena  IUD, particularly concerning migraine medications.  She has not previously engaged in structured weight loss programs such as Weight Watchers or gym memberships like [employer], although she has expressed interest in exploring these options. She is also considering the possibility of using semaglutide  in pill form, inspired by colleagues who have had success with similar treatments.     Observations/Objective:   Gen: Awake, alert, no acute distress Resp: Breathing is even and non-labored Psych: calm/pleasant demeanor Neuro: Alert and Oriented x 3, + facial symmetry,  speech is clear.   Assessment and Plan:  Assessment and Plan Assessment & Plan Morbid obesity Morbid obesity with a BMI of 46.99 and weight of 257 lbs. Previous use of Wegovy  was discontinued due to significant nausea. Medicaid coverage changes necessitate exploring new medication options. Semaglutide  alternatives considered but limited by insurance without a diabetes diagnosis. - Attempt to get Qsymia approved for weight loss - Start Qsymia at the initial dose for 14 days, then increase to the next dose - Encourage dietary modifications and lifestyle interventions - Explore Weight Watchers or Noom for additional support - Consider insurance benefits for potential coverage of weight loss programs - Schedule follow-up appointment in 4-6 weeks to assess tolerance and effectiveness of Qsymia  Encounter for weight loss counseling Focused on weight loss counseling. Discussed previous experiences with weight loss medications and explored new options due to changes in Medicaid coverage. Emphasized the importance of lifestyle changes in conjunction with medication for effective weight management. Considered compounding pharmacies and other clinics for semaglutide  options.   Follow Up Instructions:    I discussed the assessment and treatment plan with the patient. The patient was provided an opportunity to ask questions and all were answered. The patient agreed with the plan and demonstrated an understanding of the instructions.   The patient was advised to call back or seek an in-person evaluation if the symptoms worsen or if the condition fails to improve as anticipated.  Lyndzee Kliebert M Bryker Fletchall, PA-C

## 2023-09-30 NOTE — Telephone Encounter (Signed)
 Pharmacy Patient Advocate Encounter   Received notification from Onbase that prior authorization for Phentermine-Topiramate ER 3.75-23MG  er capsules  is required/requested.   Insurance verification completed.   The patient is insured through [employer] .   Per test claim: PA required; PA submitted to above mentioned insurance via Latent Key/confirmation #/EOC [facility] Status is pending

## 2023-09-30 NOTE — Patient Instructions (Signed)
  VISIT SUMMARY: During your visit, we discussed your weight loss journey, including your previous experience with Wegovy  and the need to explore new options due to changes in your Medicaid coverage. We have decided to try Qsymia and discussed various lifestyle changes and support programs to aid in your weight loss efforts.  YOUR PLAN: MORBID OBESITY: You have a BMI of 46.99 and weigh 257 lbs. You previously used Wegovy  for weight loss but had to stop due to severe nausea. We need to explore new medication options due to changes in your Medicaid coverage. -We will attempt to get Qsymia approved for your weight loss. -Start Qsymia at the initial dose for 14 days, then increase to the next dose. -Make dietary modifications and lifestyle changes to support your weight loss. -Consider joining Weight Watchers or Noom for additional support. -Check your insurance benefits for potential coverage of weight loss programs. -Schedule a follow-up appointment in 4-6 weeks to assess your tolerance and the effectiveness of Qsymia.  ENCOUNTER FOR WEIGHT LOSS COUNSELING: We focused on discussing your weight loss journey and explored new medication options due to changes in your Medicaid coverage. We also emphasized the importance of lifestyle changes for effective weight management. -Consider compounding pharmacies and other clinics for semaglutide  options.                      Contains text generated by [employer]bridge.                                 Contains text generated by [employer]bridge.

## 2023-09-30 NOTE — Telephone Encounter (Signed)
 Pt scheduled for VV to discuss more with PCP

## 2023-10-01 NOTE — Telephone Encounter (Signed)
 Please see PA denial for patient and advise if an alternative requested needs to be sent for patient

## 2023-10-01 NOTE — Telephone Encounter (Signed)
 Pharmacy Patient Advocate Encounter  Received notification from [facility] [employer] that Prior Authorization for Phentermine-Topiramate ER 3.75-23MG  er capsules  has been DENIED.  Full denial letter will be uploaded to the media tab. See denial reason below.   PA #/Case ID/Reference #: [id number]

## 2023-10-01 NOTE — Telephone Encounter (Signed)
 Called pt and lvm with PCP recommendations and cb number; advised pt to let us  know via MyChart or phone call how she would like to proceed.

## 2023-10-11 ENCOUNTER — Ambulatory Visit: Payer: Self-pay

## 2023-10-12 ENCOUNTER — Telehealth: Admitting: Family Medicine

## 2023-10-12 DIAGNOSIS — B084 Enteroviral vesicular stomatitis with exanthem: Secondary | ICD-10-CM | POA: Diagnosis not present

## 2023-10-12 NOTE — Progress Notes (Signed)
 Virtual Visit Consent   Rebecca Gibbs, you are scheduled for a virtual visit with a [facility] provider today. Just as with appointments in the office, your consent must be obtained to participate. Your consent will be active for this visit and any virtual visit you may have with one of our providers in the next 365 days. If you have a MyChart account, a copy of this consent can be sent to you electronically.  As this is a virtual visit, video technology does not allow for your provider to perform a traditional examination. This may limit your provider's ability to fully assess your condition. If your provider identifies any concerns that need to be evaluated in person or the need to arrange testing (such as labs, EKG, etc.), we will make arrangements to do so. Although advances in technology are sophisticated, we cannot ensure that it will always work on either your end or our end. If the connection with a video visit is poor, the visit may have to be switched to a telephone visit. With either a video or telephone visit, we are not always able to ensure that we have a secure connection.  By engaging in this virtual visit, you consent to the provision of healthcare and authorize for your insurance to be billed (if applicable) for the services provided during this visit. Depending on your insurance coverage, you may receive a charge related to this service.  I need to obtain your verbal consent now. Are you willing to proceed with your visit today? Rebecca Gibbs has provided verbal consent on 10/12/2023 for a virtual visit (video or telephone). Rebecca Lamp, FNP  Date: 10/12/2023 9:39 AM   Virtual Visit via Video Note   I, Rebecca Gibbs, connected with  Rebecca Gibbs  ([id number], 1995-01-10) on 10/12/23 at 10:00 AM EDT by a video-enabled telemedicine application and verified that I am speaking with the correct person using two identifiers.  Location: Patient: Virtual Visit Location Patient:  Home Provider: Virtual Visit Location Provider: Home Office   I discussed the limitations of evaluation and management by telemedicine and the availability of in person appointments. The patient expressed understanding and agreed to proceed.    History of Present Illness: Rebecca Gibbs is a 28 y.o. who identifies as a female who was assigned female at birth, and is being seen today for blisters on palms and in mouth, no fever, exposure to hand foot and mouth from children. Rebecca Gibbs  HPI: HPI  Problems:  Patient Active Problem List   Diagnosis Date Noted   Calculus of gallbladder without cholecystitis without obstruction 05/08/2018    Allergies: No Known Allergies Medications:  Current Outpatient Medications:    FLUoxetine  (PROZAC ) 40 MG capsule, Take 1 capsule (40 mg total) by mouth daily., Disp: 30 capsule, Rfl: 5   hydrOXYzine (ATARAX) 25 MG tablet, Take 25 mg by mouth 3 (three) times daily., Disp: , Rfl:    loperamide  (IMODIUM ) 2 MG capsule, Take 1 capsule (2 mg total) by mouth 4 (four) times daily as needed for diarrhea or loose stools., Disp: 12 capsule, Rfl: 0   Phentermine-Topiramate ER 3.75-23 MG CP24, Take 1 capsule by mouth in the morning for 14 days., Disp: 14 capsule, Rfl: 0   tiZANidine  (ZANAFLEX ) 4 MG tablet, Take 1 tablet (4 mg total) by mouth every 8 (eight) hours as needed for muscle spasms., Disp: 20 tablet, Rfl: 0   valACYclovir  (VALTREX ) 500 MG tablet, Take 1 tablet (500 mg total) by mouth  daily., Disp: 90 tablet, Rfl: 3  Observations/Objective: Patient is well-developed, well-nourished in no acute distress.  Resting comfortably  at home.  Head is normocephalic, atraumatic.  No labored breathing.  Speech is clear and coherent with logical content.  Patient is alert and oriented at baseline.    Assessment and Plan: 1. Hand, foot and mouth disease (Primary)  Increase fluids, Urgent care as needed.   Follow Up Instructions: I discussed the assessment and treatment  plan with the patient. The patient was provided an opportunity to ask questions and all were answered. The patient agreed with the plan and demonstrated an understanding of the instructions.  A copy of instructions were sent to the patient via MyChart unless otherwise noted below.     The patient was advised to call back or seek an in-person evaluation if the symptoms worsen or if the condition fails to improve as anticipated.    Kamar Callender, FNP

## 2023-10-12 NOTE — Patient Instructions (Signed)
Hand, Foot, and Mouth Disease, Adult Hand, foot, and mouth disease is an illness caused by a germ called a virus. It mostly happens in children younger than 27 years old. But older children and adults can get it too. It can cause: Sores in your mouth. A rash on your hands and feet. Most people get better within 1-2 weeks. What are the causes? Hand, foot, and mouth disease is contagious. That means it spreads easily from person to person. You may get it through contact with: The snot, spit, or poop of an infected person. A surface that has the germs on it. You're most contagious during the first week that you're sick. What are the signs or symptoms? Small sores in your mouth. These may hurt. A rash on your hands and feet. Sometimes, you may also get a rash on your butt, arms, legs, or other parts of your body. The rash may look like small red bumps or sores. The bumps may blister. Fever. Sore throat. Body aches or headaches. Not feeling as hungry. How is this diagnosed? Hand, foot, and mouth disease may be diagnosed with an exam. Your health care provider will look at the rash and mouth sores. In some cases, a poop sample or a swab of your throat may be taken. How is this treated? In most cases, no treatment is needed. But your provider may suggest: Medicines to help with pain and fever. A mouth rinse. This may help with pain. Follow these instructions at home: Managing pain and discomfort  Gargle with a mixture of salt and water 3-4 times a day or as needed. To make salt water, completely dissolve -1 tsp (3-6 g) of salt in 1 cup (237 mL) of warm water. To help with pain while eating: Eat soft foods. Stay away from foods and drinks that are salty or spicy. Stay away from foods and drinks that have acid in them, such as pickles and orange juice. Avoid alcohol. Eat cold food and drinks. These include water, milk, milkshakes, frozen ice pops, slushies, sherbets, and low-calorie sports  drinks. Relieving itching Keep cool and out of the sun. Sweating and feeling hot can make itching worse. Cool baths can help. Try adding baking soda or dry oatmeal to the water. Do not bathe in hot water. Put cold, wet cloths called cold compresses on itchy spots, as told by your provider. Use calamine lotion as told by your provider. This is a lotion you can get at the store to help with itching. Make sure you don't scratch or pick at your rash. To help stop scratching: Keep your fingernails clean and cut short. Wear soft gloves or mittens when you sleep. General instructions  Take or apply over-the-counter and prescription medicines only as told by your provider. Wash your hands often with soap and water for at least 20 seconds. If soap and water aren't available, use hand sanitizer. Clean surfaces and shared items that you touch a lot. Stay away from work, schools, and other groups for a few days or until your fever is gone for at least 24 hours. Return to your normal activities as told by your provider. Ask your provider what activities are safe for you. Contact a health care provider if: Your symptoms get worse. Your symptoms don't get better within 2 weeks. Your pain doesn't get better with medicine. You have trouble swallowing. You get sores or blisters on your lips or outside your mouth. You have a fever for more than 3 days. Get  help right away if: You show signs of very bad dehydration. Signs include: Peeing only very small amounts or peeing less than 3 times in 24 hours. Pee (urine) that's very dark. Dry mouth, tongue, or lips. Fewer tears or sunken eyes. Dry skin. Fast breathing. Not being active or being very sleepy. Pale skin. Fingertips that take more than 2 seconds to turn pink again after a gentle squeeze. Weight loss. You have a severe headache. You have a stiff neck. You start to act in a way that isn't normal. You have chest pain or trouble breathing. These  symptoms may be an emergency. Get help right away. Call 911. Do not wait to see if the symptoms will go away. Do not drive yourself to the hospital. This information is not intended to replace advice given to you by your health care provider. Make sure you discuss any questions you have with your health care provider. Document Revised: 03/15/2022 Document Reviewed: 03/15/2022 Elsevier Patient Education  2024 [employer].

## 2023-10-13 ENCOUNTER — Other Ambulatory Visit: Payer: Self-pay | Admitting: Physician Assistant

## 2023-10-13 DIAGNOSIS — F32A Depression, unspecified: Secondary | ICD-10-CM

## 2023-10-17 ENCOUNTER — Encounter: Payer: Self-pay | Admitting: Physician Assistant

## 2023-10-17 ENCOUNTER — Encounter: Payer: Self-pay | Admitting: Obstetrics & Gynecology

## 2023-10-18 ENCOUNTER — Other Ambulatory Visit (HOSPITAL_COMMUNITY): Payer: Self-pay

## 2023-10-25 ENCOUNTER — Other Ambulatory Visit (HOSPITAL_COMMUNITY): Payer: Self-pay

## 2023-10-28 ENCOUNTER — Other Ambulatory Visit (HOSPITAL_COMMUNITY): Payer: Self-pay

## 2023-11-01 ENCOUNTER — Other Ambulatory Visit (HOSPITAL_COMMUNITY): Payer: Self-pay

## 2023-11-14 ENCOUNTER — Ambulatory Visit: Admitting: Obstetrics & Gynecology

## 2023-11-14 ENCOUNTER — Encounter: Payer: Self-pay | Admitting: Obstetrics & Gynecology

## 2023-11-14 VITALS — BP 123/79 | HR 81 | Ht 62.0 in | Wt 256.8 lb

## 2023-11-14 DIAGNOSIS — Z30431 Encounter for routine checking of intrauterine contraceptive device: Secondary | ICD-10-CM

## 2023-11-14 DIAGNOSIS — G44219 Episodic tension-type headache, not intractable: Secondary | ICD-10-CM | POA: Diagnosis not present

## 2023-11-14 NOTE — Progress Notes (Signed)
 GYN VISIT Patient name: Rebecca Gibbs MRN [id number]  Date of birth: 12/03/1995 Chief Complaint:   Follow-up  History of Present Illness:   Rebecca Gibbs is a 28 y.o. [phone]  female being seen today for follow up regarding:  Reports concerns of elevated BPs at work.  Both in our office and PCP, BP within normal range.  At work had BP check 185/96.  Also notes occasional headaches, which she did not previously have.  Denies significant caffeine.  She does not feel like the headaches are related to BP changes at work.  Denies new stressors.  -Mirena  placed 09/23/23 with Dr. Jayne- cannot feel strings when tried to check.  Currently has random spotting for a few days and cramping.  Denies HMB or significant dysmenorrhea.  Overall she states that she is very happy with this device  Also notes worsening headache and occasional elevated blood pressures.  At work had BP check 185/96.  Denies irregular discharge, itching or irritation.  Denies other acute GYN concerns   Review of Systems:   Pertinent items are noted in HPI Denies fever/chills, dizziness, headaches, visual disturbances, fatigue, shortness of breath, chest pain, abdominal pain, vomiting, no problems with bowel movements, urination, or intercourse unless otherwise stated above.  Pertinent History Reviewed:   Past Surgical History:  Procedure Laterality Date   CHOLECYSTECTOMY N/A 05/19/2018   Procedure: LAPAROSCOPIC CHOLECYSTECTOMY;  Surgeon: Kallie Manuelita BROCKS, MD;  Location: [facility];  Service: General;  Laterality: N/A;   DILATION AND CURETTAGE OF UTERUS N/A 04/30/2019   Procedure: SUCTION DILATATION AND CURETTAGE;  Surgeon: Jayne Vonn DEL, MD;  Location: [facility];  Service: Gynecology;  Laterality: N/A;   TONSILLECTOMY      Past Medical History:  Diagnosis Date   History of gestational diabetes    Pregnant 01/07/2014   Raynaud's disease    Reviewed problem list, medications and allergies. Physical Assessment:   Vitals:    11/14/23 0948  BP: 123/79  Pulse: 81  Weight: 256 lb 12.8 oz (116.5 kg)  Height: 5' 2 (1.575 m)  Body mass index is 46.97 kg/m.       Physical Examination:   General appearance: alert, well appearing, and in no distress  Psych: mood appropriate, normal affect  Skin: warm & dry   Cardiovascular: normal heart rate noted  Respiratory: normal respiratory effort, no distress  Abdomen: soft, non-tender  Pelvic: VULVA: normal appearing vulva with no masses, tenderness or lesions, VAGINA: normal appearing vagina with normal color and discharge, no lesions, CERVIX: normal appearing cervix without discharge or lesions, strings visualized at cervical os  Extremities: no edema   Chaperone: Alan Fischer    Assessment & Plan:  1) IUD check up - device appears in proper location   2) ?Elevated BP and headaches - Encouraged patient to keep log of daily BP checks, should she note levels above 140/90 would consider follow-up with PCP - Discussed conservative options for headache management - While not likely, if patient has ruled out other etiology and thinks headaches are related to Mirena , device could always be removed to see if she noted an improvement.  It could always be replaced at a later date if desired.  Patient does not want removal today, if headaches continue she plans to follow-up with PCP   No orders of the defined types were placed in this encounter.   Return for Sept 2026 annual.   Hermina Barnard, DO Attending Obstetrician & Gynecologist, [facility] for [facility]  Healthcare, [facility]

## 2024-07-30 ENCOUNTER — Encounter: Admitting: Physician Assistant
